# Patient Record
Sex: Female | Born: 1960 | Race: White | Hispanic: No | Smoking: Current every day smoker
Health system: Southern US, Community
[De-identification: ages and names within clinical notes are randomized; demographics above are authoritative.]

## PROBLEM LIST (undated history)

## (undated) DIAGNOSIS — I10 Essential (primary) hypertension: Secondary | ICD-10-CM

## (undated) DIAGNOSIS — R519 Headache, unspecified: Secondary | ICD-10-CM

## (undated) DIAGNOSIS — F32A Depression, unspecified: Secondary | ICD-10-CM

## (undated) DIAGNOSIS — F329 Major depressive disorder, single episode, unspecified: Secondary | ICD-10-CM

## (undated) DIAGNOSIS — T7840XA Allergy, unspecified, initial encounter: Secondary | ICD-10-CM

## (undated) DIAGNOSIS — M1990 Unspecified osteoarthritis, unspecified site: Secondary | ICD-10-CM

## (undated) HISTORY — DX: Major depressive disorder, single episode, unspecified: F32.9

## (undated) HISTORY — DX: Depression, unspecified: F32.A

## (undated) HISTORY — DX: Allergy, unspecified, initial encounter: T78.40XA

## (undated) HISTORY — DX: Unspecified osteoarthritis, unspecified site: M19.90

## (undated) HISTORY — DX: Essential (primary) hypertension: I10

## (undated) HISTORY — PX: TUBAL LIGATION: SHX77

---

## 1997-11-29 ENCOUNTER — Ambulatory Visit (HOSPITAL_COMMUNITY): Admission: RE | Admit: 1997-11-29 | Discharge: 1997-11-29 | Payer: Self-pay | Admitting: *Deleted

## 2000-11-10 ENCOUNTER — Other Ambulatory Visit: Admission: RE | Admit: 2000-11-10 | Discharge: 2000-11-10 | Payer: Self-pay | Admitting: Obstetrics and Gynecology

## 2005-06-05 ENCOUNTER — Other Ambulatory Visit: Admission: RE | Admit: 2005-06-05 | Discharge: 2005-06-05 | Payer: Self-pay | Admitting: Obstetrics and Gynecology

## 2010-10-07 ENCOUNTER — Encounter: Payer: Self-pay | Admitting: Family Medicine

## 2010-12-06 DIAGNOSIS — R079 Chest pain, unspecified: Secondary | ICD-10-CM

## 2010-12-18 ENCOUNTER — Ambulatory Visit (HOSPITAL_COMMUNITY)
Admission: RE | Admit: 2010-12-18 | Discharge: 2010-12-18 | Disposition: A | Discharge status: Home / Self Care | Payer: PRIVATE HEALTH INSURANCE | Source: Ambulatory Visit | Attending: Cardiovascular Disease | Admitting: Cardiovascular Disease

## 2010-12-18 DIAGNOSIS — F17200 Nicotine dependence, unspecified, uncomplicated: Secondary | ICD-10-CM | POA: Insufficient documentation

## 2010-12-18 DIAGNOSIS — R079 Chest pain, unspecified: Secondary | ICD-10-CM | POA: Insufficient documentation

## 2010-12-18 DIAGNOSIS — I10 Essential (primary) hypertension: Secondary | ICD-10-CM | POA: Insufficient documentation

## 2010-12-18 DIAGNOSIS — E785 Hyperlipidemia, unspecified: Secondary | ICD-10-CM | POA: Insufficient documentation

## 2010-12-27 NOTE — Procedures (Signed)
  NAMELOURA, PITT             ACCOUNT NO.:  [id number]  MEDICAL RECORD NO.:  [id number]           PATIENT TYPE:  O  LOCATION:  [facility]                         FACILITY:  [facility]  PHYSICIAN:  Nicki Guadalajara, M.D.     DATE OF BIRTH:  [birth year]  DATE OF PROCEDURE: DATE OF DISCHARGE:                           CARDIAC CATHETERIZATION   INDICATIONS:  Ms. Jamie Walters is a 50 year old female who has a history of hypertension, hyperlipidemia, tobacco use, is referred for diagnostic cardiac catheterization by Dr. Alanda Amass.  The patient recently had experienced episodes of chest pain leading to her recent [facility] evaluation.  Apparently, a nuclear perfusion study was performed with questionable inferior defects with minimal reversibility. With her risk factor profile, she is now referred for definitive diagnostic cardiac catheterization.  PROCEDURE:  After premedication with Versed 2 mg plus fentanyl 25 mcg, the patient was prepped and draped in usual fashion.  Right femoral artery was punctured anteriorly and a 5-French sheath was inserted. Diagnostic cardiac catheterization was done utilizing 5-French Judkins 4 left catheter as well as ultimately a no torque right catheter was necessary for selective angiography in the right coronary artery.  A 5- French pigtail catheter was used for left ventriculography as well as distal aortography with the patient's hypertensive history.  She tolerated the procedure well.  Hemostasis was obtained by direct manual pressure.  HEMODYNAMIC DATA:  Central aortic pressure is 120/66.  Left ventricular pressure 120/15.  ANGIOGRAPHIC DATA:  Left main coronary artery was angiographically normal and bifurcated into an LAD and circumflex system.  The LAD was angiographically normal gave rise to several diagonal septal perforating arteries and was free of disease.  The circumflex vessel was angiographically normal and gave rise to one major  marginal vessel.  The right coronary artery was a large-caliber dominant vessel which gave rise to a PDA and posterolateral system.  Left ventriculography revealed normal LV contractility with an ejection fraction of approximately 65% without focal segmental wall motion abnormalities.  Distal aortography revealed widely patent renal arteries.  There was no significant aortoiliac disease.  IMPRESSION: 1. Normal left ventricular function with an ejection fraction of     approximately 65%. 2. Normal coronary arteries. 3. Normal aortoiliac system.          ______________________________ Nicki Guadalajara, M.D.     TK/MEDQ  D:  12/18/2010  T:  12/19/2010  Job:  [id number]  cc:   Gerlene Burdock A. Alanda Amass, M.D. Jennefer Bravo, MD  Electronically Signed by Nicki Guadalajara M.D. on 12/27/2010 10:53:47 AM

## 2011-01-04 ENCOUNTER — Encounter: Payer: PRIVATE HEALTH INSURANCE | Admitting: Cardiology

## 2011-10-28 ENCOUNTER — Other Ambulatory Visit: Payer: Self-pay | Admitting: Physician Assistant

## 2011-10-29 ENCOUNTER — Other Ambulatory Visit: Payer: Self-pay | Admitting: Physician Assistant

## 2011-10-29 MED ORDER — HYDROCODONE-ACETAMINOPHEN 10-500 MG PO TABS: 1.0000 | ORAL_TABLET | Freq: Four times a day (QID) | ORAL | Status: AC | PRN

## 2011-11-26 ENCOUNTER — Other Ambulatory Visit: Payer: Self-pay | Admitting: Physician Assistant

## 2011-12-26 ENCOUNTER — Ambulatory Visit: Payer: Self-pay | Admitting: Physician Assistant

## 2011-12-26 ENCOUNTER — Other Ambulatory Visit: Payer: Self-pay | Admitting: Physician Assistant

## 2011-12-28 ENCOUNTER — Other Ambulatory Visit: Payer: Self-pay | Admitting: Physician Assistant

## 2011-12-31 ENCOUNTER — Telehealth: Payer: Self-pay

## 2011-12-31 NOTE — Telephone Encounter (Signed)
/  [facility] PATIENT  IS ASKING WHY SHE HAS NOT GOTTEN A REFILL ON HER HYDROCODONE? SHE SAID THE PHARMACY CALLED [facility] ON Sunday AND Monday AND THEY HAVE NOT GOTTEN A RESPONSE BACK FROM [place]. SHE USUALLY SEES [name] J. SHE NEEDS IT FILLED AS SOON AS POSSIBLE PLEASE. BEST PHONE [phone] (CELL)         PHARMACY CHOICE IS [facility]          [facility]

## 2012-01-01 ENCOUNTER — Telehealth: Payer: Self-pay

## 2012-01-01 ENCOUNTER — Other Ambulatory Visit: Payer: Self-pay | Admitting: Physician Assistant

## 2012-01-01 NOTE — Telephone Encounter (Signed)
Please phone in order for Hydrocodone.

## 2012-01-01 NOTE — Telephone Encounter (Signed)
LMOM for pt to call back. Chart on the back of TL desk in Clinical calls box.

## 2012-01-01 NOTE — Telephone Encounter (Signed)
I need to see her. I will authorize a 10-month supply (#120) if she will run out before she sees me.

## 2012-01-02 NOTE — Telephone Encounter (Signed)
Please contact pt on her cell she is not at home [phone]

## 2012-01-03 ENCOUNTER — Other Ambulatory Visit: Payer: Self-pay | Admitting: Physician Assistant

## 2012-01-03 NOTE — Telephone Encounter (Signed)
Pt CB. Explained that she will need to RTC to see [name] but that 1 mos supply authorized and I can call it in now for pt to [facility] in [place]. Pt stated she has appt sch beg of May. Called in Rx

## 2012-01-16 ENCOUNTER — Ambulatory Visit (INDEPENDENT_AMBULATORY_CARE_PROVIDER_SITE_OTHER): Payer: PRIVATE HEALTH INSURANCE | Admitting: Physician Assistant

## 2012-01-16 ENCOUNTER — Encounter: Payer: Self-pay | Admitting: Physician Assistant

## 2012-01-16 VITALS — BP 120/72 | HR 79 | Temp 98.7°F | Resp 16 | Ht 61.0 in | Wt 135.6 lb

## 2012-01-16 DIAGNOSIS — F329 Major depressive disorder, single episode, unspecified: Secondary | ICD-10-CM

## 2012-01-16 DIAGNOSIS — IMO0002 Reserved for concepts with insufficient information to code with codable children: Secondary | ICD-10-CM

## 2012-01-16 DIAGNOSIS — F32A Depression, unspecified: Secondary | ICD-10-CM

## 2012-01-16 DIAGNOSIS — F3289 Other specified depressive episodes: Secondary | ICD-10-CM

## 2012-01-16 DIAGNOSIS — I10 Essential (primary) hypertension: Secondary | ICD-10-CM

## 2012-01-16 DIAGNOSIS — M25569 Pain in unspecified knee: Secondary | ICD-10-CM

## 2012-01-16 DIAGNOSIS — E785 Hyperlipidemia, unspecified: Secondary | ICD-10-CM

## 2012-01-16 LAB — COMPREHENSIVE METABOLIC PANEL
ALT: 13 U/L (ref 0–35)
AST: 18 U/L (ref 0–37)
Albumin: 4.1 g/dL (ref 3.5–5.2)
BUN: 12 mg/dL (ref 6–23)
Calcium: 8.6 mg/dL (ref 8.4–10.5)
Chloride: 102 mEq/L (ref 96–112)
Potassium: 4.2 mEq/L (ref 3.5–5.3)
Sodium: 136 mEq/L (ref 135–145)
Total Protein: 6.8 g/dL (ref 6.0–8.3)

## 2012-01-16 LAB — LIPID PANEL: LDL Cholesterol: 151 mg/dL — ABNORMAL HIGH (ref 0–99)

## 2012-01-16 MED ORDER — MELOXICAM 15 MG PO TABS: 15.0000 mg | ORAL_TABLET | Freq: Every day | ORAL | Status: DC

## 2012-01-16 MED ORDER — BUPROPION HCL ER (XL) 300 MG PO TB24: 300.0000 mg | ORAL_TABLET | Freq: Every day | ORAL | Status: DC

## 2012-01-16 MED ORDER — PRAVASTATIN SODIUM 40 MG PO TABS: 40.0000 mg | ORAL_TABLET | Freq: Every day | ORAL | Status: DC

## 2012-01-16 MED ORDER — HYDROCODONE-ACETAMINOPHEN 10-500 MG PO TABS: 1.0000 | ORAL_TABLET | Freq: Four times a day (QID) | ORAL | Status: DC | PRN

## 2012-01-16 NOTE — Patient Instructions (Signed)
We have increased your Wellbutrin XR. We have added Meloxicam for your knee pain.   Please continue with exercise. Please consider counseling for your depression.    RICE: Routine Care for Injuries The routine care of many injuries includes Rest, Ice, Compression, and Elevation (RICE). HOME CARE INSTRUCTIONS  Rest is needed to allow your body to heal. Routine activities can usually be resumed when comfortable. Injured tendons and bones can take up to 6 weeks to heal. Tendons are the cord-like structures that attach muscle to bone.   Ice following an injury helps keep the swelling down and reduces pain.   Put ice in a plastic bag.   Place a towel between your skin and the bag.   Leave the ice on for 15 to 20 minutes, 3 to 4 times a day. Do this while awake, for the first 24 to 48 hours. After that, continue as directed by your caregiver.   Compression helps keep swelling down. It also gives support and helps with discomfort. If an elastic bandage has been applied, it should be removed and reapplied every 3 to 4 hours. It should not be applied tightly, but firmly enough to keep swelling down. Watch fingers or toes for swelling, bluish discoloration, coldness, numbness, or excessive pain. If any of these problems occur, remove the bandage and reapply loosely. Contact your caregiver if these problems continue.   Elevation helps reduce swelling and decreases pain. With extremities, such as the arms, hands, legs, and feet, the injured area should be placed near or above the level of the heart, if possible.  SEEK IMMEDIATE MEDICAL CARE IF:  You have persistent pain and swelling.   You develop redness, numbness, or unexpected weakness.   Your symptoms are getting worse rather than improving after several days.  These symptoms may indicate that further evaluation or further X-rays are needed. Sometimes, X-rays may not show a small broken bone (fracture) until 1 week or 10 days later. Make a  follow-up appointment with your caregiver. Ask when your X-ray results will be ready. Make sure you get your X-ray results. Document Released: 12/15/2000 Document Revised: 08/22/2011 Document Reviewed: 02/01/2011 [facility] Patient Information 2012 [place], [place].

## 2012-01-16 NOTE — Progress Notes (Signed)
I have examined this patient along with the student and agree.  

## 2012-01-16 NOTE — Progress Notes (Signed)
  Subjective:    Patient ID: Jamie Walters, female    DOB: 1960/11/02, 51 y.o.   MRN: [id number]  Hypertension  Hyperlipidemia   Jamie Walters is a 50 y/o female with a history of chronic depression, DDD of the lumbar spine, chronic low back pain, HTN and hyperlipidemia who presents today for med refills. She aslo c/o left knee pain.  Jamie Walters states that she is weepy and tired all the time.  She also has a lack of interest in sex and other pleasurable activities. She has to make herself get out of bed. Her pain levels have been worse lately.  She feels that her Wellbutrin is not working for her depression. Her appetite is good.  She denies SI.  She denies fevers, myalgias, soaking night sweats, unintended weight change or other constitutional symptoms.  Patient has been trying to exercise.  She has recently started walking 4 mi/day. On Saturday, she awoke with left knee pain and swelling.  Pain is worse with flexion. She recalls no injury and has no hx of injury to the joint. She denies clicking or popping. She does not feel as if her knee will give way or slide.    Review of Systems    as stated in HPI Objective:   Physical Exam  Vitals reviewed. Constitutional: She appears well-developed and well-nourished.       Sad appearing. Patient is weepy during exam.  Cardiovascular: Normal rate, regular rhythm and normal heart sounds.   Pulmonary/Chest: Effort normal and breath sounds normal. No respiratory distress.  Musculoskeletal:       Left Knee, Full A/PROM, crepitus present, TTP over the medial joint line with swelling consistent with small effusion.    Filed Vitals:   01/16/12 1418  BP: 120/72  Pulse: 79  Temp: 98.7 F (37.1 C)  Resp: 16         Assessment & Plan:   1. DDD (degenerative disc disease)  HYDROcodone-acetaminophen (LORTAB) 10-500 MG per tablet. Suggested trial of massage therapy..  2. Depression  buPROPion (WELLBUTRIN XL) 300 MG 24 hr tablet. Suggested  psychological counseling and continued exercise.    3. Hyperlipidemia  pravastatin (PRAVACHOL) 40 MG tablet, Comprehensive metabolic panel, Lipid Panel  4. Knee pain  meloxicam (MOBIC) 15 MG tablet, HYDROcodone-acetaminophen (LORTAB) 10-500 MG per tablet. Continue with low impact exercise including bicycling, swimming.  RICE when not active.   Supportive care see pt instructions.

## 2012-02-24 ENCOUNTER — Other Ambulatory Visit: Payer: Self-pay | Admitting: Physician Assistant

## 2012-03-17 ENCOUNTER — Other Ambulatory Visit: Payer: Self-pay | Admitting: Family Medicine

## 2012-03-17 DIAGNOSIS — M25569 Pain in unspecified knee: Secondary | ICD-10-CM

## 2012-03-17 DIAGNOSIS — F32A Depression, unspecified: Secondary | ICD-10-CM

## 2012-03-17 DIAGNOSIS — F329 Major depressive disorder, single episode, unspecified: Secondary | ICD-10-CM

## 2012-03-17 MED ORDER — BUPROPION HCL ER (XL) 300 MG PO TB24: 300.0000 mg | ORAL_TABLET | Freq: Every day | ORAL | Status: DC

## 2012-03-17 MED ORDER — MELOXICAM 15 MG PO TABS: 15.0000 mg | ORAL_TABLET | Freq: Every day | ORAL | Status: DC

## 2012-03-26 ENCOUNTER — Telehealth: Payer: Self-pay

## 2012-03-26 NOTE — Telephone Encounter (Signed)
Pt called pharmacy and requested rx refill on hydrocodone and would like it to be [facility] [place] she says pharmacy hasnt heard anything from [place].

## 2012-03-27 MED ORDER — HYDROCODONE-ACETAMINOPHEN 10-500 MG PO TABS: 1.0000 | ORAL_TABLET | Freq: Four times a day (QID) | ORAL | Status: DC | PRN

## 2012-03-27 NOTE — Telephone Encounter (Signed)
Done and printed

## 2012-03-27 NOTE — Telephone Encounter (Signed)
LMOM for pt that we have taken care of her request.

## 2012-04-21 ENCOUNTER — Other Ambulatory Visit: Payer: Self-pay | Admitting: Physician Assistant

## 2012-04-23 ENCOUNTER — Other Ambulatory Visit: Payer: Self-pay | Admitting: *Deleted

## 2012-04-23 NOTE — Telephone Encounter (Signed)
Rx done and ready to be faxed 

## 2012-04-26 ENCOUNTER — Other Ambulatory Visit: Payer: Self-pay | Admitting: Physician Assistant

## 2012-04-26 NOTE — Telephone Encounter (Signed)
Deny--refilled on 04/21/2012

## 2012-05-14 ENCOUNTER — Other Ambulatory Visit: Payer: Self-pay | Admitting: Physician Assistant

## 2012-05-29 ENCOUNTER — Telehealth: Payer: Self-pay

## 2012-05-29 MED ORDER — HYDROCODONE-ACETAMINOPHEN 10-500 MG PO TABS: 1.0000 | ORAL_TABLET | Freq: Four times a day (QID) | ORAL | Status: DC | PRN

## 2012-05-29 NOTE — Telephone Encounter (Signed)
Patient advised it was sent for her.

## 2012-05-29 NOTE — Telephone Encounter (Signed)
Rx done and ready to be faxed 

## 2012-05-29 NOTE — Telephone Encounter (Signed)
Patient would like hydrocodone refill.  Best [phone]

## 2012-05-29 NOTE — Telephone Encounter (Signed)
Was last seen in May by [facility]

## 2012-06-15 ENCOUNTER — Other Ambulatory Visit: Payer: Self-pay | Admitting: Physician Assistant

## 2012-06-29 ENCOUNTER — Encounter: Payer: Self-pay | Admitting: Physician Assistant

## 2012-06-30 ENCOUNTER — Telehealth: Payer: Self-pay

## 2012-06-30 NOTE — Telephone Encounter (Signed)
Checking on the status of pharmacy request for her HYDROcodone-acetaminophen (LORTAB) 10-500 MG per tablet   [phone] (M)

## 2012-07-02 ENCOUNTER — Telehealth: Payer: Self-pay

## 2012-07-02 MED ORDER — HYDROCODONE-ACETAMINOPHEN 10-500 MG PO TABS: 1.0000 | ORAL_TABLET | Freq: Four times a day (QID) | ORAL | Status: DC | PRN

## 2012-07-02 NOTE — Telephone Encounter (Signed)
PATIENT IS CALLING FOR HYDROCODONE RX REFILL THAT WE NORMALLY FAX TO [facility] IN [place], [place] SO SHE DOESN'T HAVE TO DRIVE TO PICK IT UP HERE. SHE CALLED 06/30/12 AS WELL, BUT I DON'T SEE ANY FURTHER NOTES ON THAT ENCOUTER.  BEST [phone]  [id number] - [place], [place] - [id number]

## 2012-07-02 NOTE — Telephone Encounter (Signed)
Called it in for her. Patient advised.

## 2012-07-02 NOTE — Telephone Encounter (Signed)
Please advise on hydrocodone refill, previous call was not routed.

## 2012-07-02 NOTE — Telephone Encounter (Signed)
Rx printed.  Fax/call it to her pharmacy per her request.

## 2012-07-15 ENCOUNTER — Other Ambulatory Visit: Payer: Self-pay | Admitting: Physician Assistant

## 2012-07-29 ENCOUNTER — Other Ambulatory Visit: Payer: Self-pay | Admitting: Physician Assistant

## 2012-08-14 ENCOUNTER — Other Ambulatory Visit: Payer: Self-pay | Admitting: Physician Assistant

## 2012-09-01 ENCOUNTER — Telehealth: Payer: Self-pay | Admitting: *Deleted

## 2012-09-01 MED ORDER — HYDROCODONE-ACETAMINOPHEN 10-325 MG PO TABS: 1.0000 | ORAL_TABLET | Freq: Four times a day (QID) | ORAL | Status: DC | PRN

## 2012-09-01 NOTE — Telephone Encounter (Signed)
Pharmacy requesting refill on hydrocodone.  Also needing to change dose to 10/325.  Does not come in 10/500 anymore.

## 2012-09-01 NOTE — Telephone Encounter (Signed)
rx printed

## 2012-09-02 NOTE — Telephone Encounter (Signed)
rx sent in 

## 2012-09-10 ENCOUNTER — Other Ambulatory Visit: Payer: Self-pay | Admitting: Physician Assistant

## 2012-09-15 ENCOUNTER — Other Ambulatory Visit: Payer: Self-pay | Admitting: Physician Assistant

## 2012-09-29 ENCOUNTER — Other Ambulatory Visit: Payer: Self-pay | Admitting: Physician Assistant

## 2012-10-02 NOTE — Telephone Encounter (Signed)
PT CALLED REGARDING HER REFILL ON HER HYDROCODONE, PT STATES THAT HER PHARMACY HAS SENT OVER A REFILL REQUEST ABOUT THREE DAYS AGO. PLEASE ADVISE. BEST# [phone]

## 2012-10-02 NOTE — Telephone Encounter (Signed)
Please advise on refill. Patient was last seen in May.

## 2012-10-03 ENCOUNTER — Other Ambulatory Visit: Payer: Self-pay | Admitting: Physician Assistant

## 2012-10-04 NOTE — Telephone Encounter (Signed)
Called in for her. Pt advised

## 2012-10-16 ENCOUNTER — Telehealth: Payer: Self-pay

## 2012-10-16 MED ORDER — BUPROPION HCL ER (XL) 300 MG PO TB24: 300.0000 mg | ORAL_TABLET | ORAL | Status: DC

## 2012-10-16 NOTE — Telephone Encounter (Signed)
PT STATES WE HAVE DENIED HER WELLBUTRIN AND SHE WOULD LIKE TO KNOW WHY. WE HAVE ALWAYS REFILLED IT EVERY MONTH FOR HER PLEASE CALL HER CELL AND LEAVE MESSAGE [phone]

## 2012-10-16 NOTE — Telephone Encounter (Signed)
Does she need to RTC? 

## 2012-10-16 NOTE — Telephone Encounter (Signed)
I do not see a refill request from the pharmacy for Wellbutrin, but I have sent it in.  Pt needs an appointment, has not been seen since May 2013

## 2012-10-16 NOTE — Telephone Encounter (Signed)
Left message for patient to advise

## 2012-11-02 ENCOUNTER — Other Ambulatory Visit: Payer: Self-pay | Admitting: Physician Assistant

## 2012-11-03 ENCOUNTER — Telehealth: Payer: Self-pay

## 2012-11-03 NOTE — Telephone Encounter (Signed)
This has been sent for her.

## 2012-11-03 NOTE — Telephone Encounter (Signed)
Patient says she has contacted her pharmacy about her hydrocodone she has appt in march, just wanted to let us know because we seem to have trouble with this each month she says.

## 2012-11-03 NOTE — Telephone Encounter (Signed)
Called her to advise. Left message  

## 2012-11-14 ENCOUNTER — Other Ambulatory Visit: Payer: Self-pay | Admitting: Physician Assistant

## 2012-11-19 ENCOUNTER — Encounter: Payer: PRIVATE HEALTH INSURANCE | Admitting: Physician Assistant

## 2012-12-03 ENCOUNTER — Ambulatory Visit (INDEPENDENT_AMBULATORY_CARE_PROVIDER_SITE_OTHER): Payer: PRIVATE HEALTH INSURANCE | Admitting: Physician Assistant

## 2012-12-03 ENCOUNTER — Encounter: Payer: Self-pay | Admitting: Physician Assistant

## 2012-12-03 VITALS — BP 126/72 | HR 75 | Temp 98.3°F | Resp 16 | Ht 61.0 in | Wt 134.4 lb

## 2012-12-03 DIAGNOSIS — R51 Headache: Secondary | ICD-10-CM

## 2012-12-03 DIAGNOSIS — F32A Depression, unspecified: Secondary | ICD-10-CM | POA: Insufficient documentation

## 2012-12-03 DIAGNOSIS — B3731 Acute candidiasis of vulva and vagina: Secondary | ICD-10-CM

## 2012-12-03 DIAGNOSIS — F3289 Other specified depressive episodes: Secondary | ICD-10-CM

## 2012-12-03 DIAGNOSIS — R519 Headache, unspecified: Secondary | ICD-10-CM | POA: Insufficient documentation

## 2012-12-03 DIAGNOSIS — Z720 Tobacco use: Secondary | ICD-10-CM

## 2012-12-03 DIAGNOSIS — M5137 Other intervertebral disc degeneration, lumbosacral region: Secondary | ICD-10-CM

## 2012-12-03 DIAGNOSIS — Z1159 Encounter for screening for other viral diseases: Secondary | ICD-10-CM

## 2012-12-03 DIAGNOSIS — I779 Disorder of arteries and arterioles, unspecified: Secondary | ICD-10-CM | POA: Insufficient documentation

## 2012-12-03 DIAGNOSIS — B373 Candidiasis of vulva and vagina: Secondary | ICD-10-CM

## 2012-12-03 DIAGNOSIS — I10 Essential (primary) hypertension: Secondary | ICD-10-CM

## 2012-12-03 DIAGNOSIS — L293 Anogenital pruritus, unspecified: Secondary | ICD-10-CM

## 2012-12-03 DIAGNOSIS — M5116 Intervertebral disc disorders with radiculopathy, lumbar region: Secondary | ICD-10-CM | POA: Insufficient documentation

## 2012-12-03 DIAGNOSIS — F329 Major depressive disorder, single episode, unspecified: Secondary | ICD-10-CM

## 2012-12-03 DIAGNOSIS — J309 Allergic rhinitis, unspecified: Secondary | ICD-10-CM

## 2012-12-03 DIAGNOSIS — Z1211 Encounter for screening for malignant neoplasm of colon: Secondary | ICD-10-CM

## 2012-12-03 DIAGNOSIS — E785 Hyperlipidemia, unspecified: Secondary | ICD-10-CM

## 2012-12-03 DIAGNOSIS — N898 Other specified noninflammatory disorders of vagina: Secondary | ICD-10-CM

## 2012-12-03 DIAGNOSIS — F17200 Nicotine dependence, unspecified, uncomplicated: Secondary | ICD-10-CM

## 2012-12-03 LAB — CBC WITH DIFFERENTIAL/PLATELET
Basophils Absolute: 0 10*3/uL (ref 0.0–0.1)
Basophils Relative: 0 % (ref 0–1)
Eosinophils Absolute: 0.2 10*3/uL (ref 0.0–0.7)
HCT: 39 % (ref 36.0–46.0)
Hemoglobin: 13.1 g/dL (ref 12.0–15.0)
Lymphs Abs: 3.2 10*3/uL (ref 0.7–4.0)
MCV: 91.3 fL (ref 78.0–100.0)
Neutrophils Relative %: 44 % (ref 43–77)
Platelets: 285 10*3/uL (ref 150–400)
WBC: 7.5 10*3/uL (ref 4.0–10.5)

## 2012-12-03 LAB — POCT UA - MICROSCOPIC ONLY: Yeast, UA: NEGATIVE

## 2012-12-03 LAB — POCT URINALYSIS DIPSTICK
Bilirubin, UA: NEGATIVE
Glucose, UA: NEGATIVE
Nitrite, UA: NEGATIVE
Spec Grav, UA: 1.01
Urobilinogen, UA: 0.2

## 2012-12-03 LAB — POCT WET PREP WITH KOH
Clue Cells Wet Prep HPF POC: NEGATIVE
RBC Wet Prep HPF POC: NEGATIVE
Trichomonas, UA: NEGATIVE

## 2012-12-03 MED ORDER — FLUCONAZOLE 150 MG PO TABS: 150.0000 mg | ORAL_TABLET | Freq: Once | ORAL | Status: DC

## 2012-12-03 MED ORDER — IPRATROPIUM BROMIDE 0.03 % NA SOLN: 2.0000 | Freq: Two times a day (BID) | NASAL | Status: DC

## 2012-12-03 MED ORDER — HYDROCODONE-ACETAMINOPHEN 10-325 MG PO TABS: 1.0000 | ORAL_TABLET | Freq: Four times a day (QID) | ORAL | Status: DC | PRN

## 2012-12-03 MED ORDER — PRAVASTATIN SODIUM 40 MG PO TABS: 40.0000 mg | ORAL_TABLET | Freq: Every day | ORAL | Status: DC

## 2012-12-03 MED ORDER — METOPROLOL SUCCINATE ER 25 MG PO TB24: 25.0000 mg | ORAL_TABLET | Freq: Every day | ORAL | Status: DC

## 2012-12-03 MED ORDER — VARENICLINE TARTRATE 1 MG PO TABS: 1.0000 mg | ORAL_TABLET | Freq: Two times a day (BID) | ORAL | Status: DC

## 2012-12-03 MED ORDER — BUPROPION HCL ER (XL) 300 MG PO TB24: 300.0000 mg | ORAL_TABLET | ORAL | Status: DC

## 2012-12-03 NOTE — Patient Instructions (Addendum)
If you have not heard anything regarding the colonoscopy referral in 10 days, please contact our office.  Select a quit date at least 7 days in the future.  Start the Chantix by taking 1/2 tablet each evening x 3 days, then take 1/2 tablet twice dailt x 3 days, then 1/2 tablet each morning and 1 tablet each evening x 3 days, the 1 tablet twice daily.

## 2012-12-03 NOTE — Progress Notes (Signed)
Subjective:    Patient ID: Jamie Walters, female    DOB: [birth year], 52 y.o.   MRN: [id number]  HPI This 52 y.o. female presents for evaluation of her chronic medical problems.  This visit was originally scheduled as a CPE, but unfortunately inadequate time was allotted.  Her CPE has been rescheduled.   Past Medical History  Diagnosis Date  . Arthritis   . Depression   . Hypertension   . Allergy     Past Surgical History  Procedure Laterality Date  . Tubal ligation      Prior to Admission medications   Medication Sig Start Date End Date Taking? Authorizing Provider  aspirin 81 MG tablet Take 81 mg by mouth daily.   Yes Historical Provider, MD  buPROPion (WELLBUTRIN XL) 300 MG 24 hr tablet Take 1 tablet (300 mg total) by mouth every morning. 12/03/12  Yes Ulice Follett S Jamauri Kruzel, [name]  HYDROcodone-acetaminophen (NORCO) 10-325 MG per tablet Take 1 tablet by mouth every 6 (six) hours as needed for pain. Need office visit for additional refills. 12/03/12  Yes Yasin Ducat S Amarri Michaelson, [name]  metoprolol succinate (TOPROL-XL) 25 MG 24 hr tablet Take 1 tablet (25 mg total) by mouth daily. 12/03/12  Yes Gradyn Shein S Joyia Riehle, [name]  pravastatin (PRAVACHOL) 40 MG tablet Take 1 tablet (40 mg total) by mouth daily. 12/03/12  Yes Charlotte Brafford S Delsie Amador, [name]  ipratropium (ATROVENT) 0.03 % nasal spray Place 2 sprays into the nose 2 (two) times daily. 12/03/12   Akshat Minehart Tessa Lerner, [name]  varenicline (CHANTIX) 1 MG tablet Take 1 tablet (1 mg total) by mouth 2 (two) times daily. Use for smoking cessation. 12/03/12   Ralpheal Zappone Tessa Lerner, [name]    No Known Allergies  History   Social History  . Marital Status: Divorced    Spouse Name: Tiburcio Bash    Number of Children: 2  . Years of Education: 11   Occupational History  . Lead Inspector    Social History Main Topics  . Smoking status: Current Every Day Smoker    Types: Cigarettes  . Smokeless tobacco: Not on file     Comment: "im really trying." Interested in Chantix.  Marland Kitchen  Alcohol Use: No  . Drug Use: No  . Sexually Active: Yes -- Female partner(s)    Birth Control/ Protection: Post-menopausal   Other Topics Concern  . Not on file   Social History Narrative   Lives with her common law husband.    Family History  Problem Relation Age of Onset  . Hypertension Mother   . Hyperlipidemia Mother   . COPD Father   . Alcohol abuse Father   . Arthritis Father   . Cancer Father     bone cancer  . Hypertension Sister   . COPD Brother   . COPD Brother    Review of Systems  Constitutional: Negative.   HENT: Positive for congestion, rhinorrhea and postnasal drip.   Eyes: Negative.   Respiratory: Negative.   Cardiovascular: Negative.   Gastrointestinal: Negative.   Endocrine: Negative.   Genitourinary: Negative for dysuria, urgency, frequency, hematuria and vaginal discharge (vaingal itching intermittently x several months, uses OTC anti-fungal as needed with temporary relief).  Musculoskeletal: Positive for myalgias (back) and back pain (seems worse than before, but current treatment is adequate.  Has seen neurosurgery previously.). Negative for joint swelling.  Allergic/Immunologic: Negative.   Neurological: Negative.   Hematological: Negative.   Psychiatric/Behavioral: Positive for dysphoric mood (wonders if she can increase the dose of  Wellbutrin XL to 600 mg). Negative for self-injury. The patient is not nervous/anxious.        Objective:   Physical Exam  Vitals reviewed. Constitutional: She is oriented to person, place, and time. Vital signs are normal. She appears well-developed and well-nourished. She is active and cooperative. No distress.  HENT:  Head: Normocephalic and atraumatic.  Right Ear: Hearing and external ear normal.  Left Ear: Hearing and external ear normal.  Nose: Nose normal.  Mouth/Throat: No oropharyngeal exudate.  Drainage noted in the posterior pharynx  Eyes: Conjunctivae and EOM are normal. Pupils are equal, round, and  reactive to light. Right eye exhibits no discharge. Left eye exhibits no discharge. No scleral icterus.  Neck: Normal range of motion. Neck supple. No thyromegaly present.  Cardiovascular: Normal rate, regular rhythm and normal heart sounds.   Pulses:      Radial pulses are 2+ on the right side, and 2+ on the left side.       Dorsalis pedis pulses are 2+ on the right side, and 2+ on the left side.       Posterior tibial pulses are 2+ on the right side, and 2+ on the left side.  Pulmonary/Chest: Effort normal and breath sounds normal.  Musculoskeletal: Normal range of motion. She exhibits tenderness (lumar paraspinous muscles). She exhibits no edema.       Thoracic back: Normal.       Lumbar back: She exhibits tenderness. She exhibits no bony tenderness, no swelling, no edema, no deformity, no laceration and no spasm.  Lymphadenopathy:       Head (right side): No tonsillar, no preauricular, no posterior auricular and no occipital adenopathy present.       Head (left side): No tonsillar, no preauricular, no posterior auricular and no occipital adenopathy present.    She has no cervical adenopathy.       Right: No supraclavicular adenopathy present.       Left: No supraclavicular adenopathy present.  Neurological: She is alert and oriented to person, place, and time. No sensory deficit.  Skin: Skin is warm, dry and intact. No rash noted. No cyanosis or erythema. Nails show no clubbing.  Psychiatric: She has a normal mood and affect. Her behavior is normal. Judgment normal.      Results for orders placed in visit on 12/03/12  POCT UA - MICROSCOPIC ONLY      Result Value Range   WBC, Ur, HPF, POC 0-3     RBC, urine, microscopic 0-3     Bacteria, U Microscopic trace     Mucus, UA trace     Epithelial cells, urine per micros 0-8     Crystals, Ur, HPF, POC neg     Casts, Ur, LPF, POC neg     Yeast, UA neg    POCT URINALYSIS DIPSTICK      Result Value Range   Color, UA yellow      Clarity, UA clear     Glucose, UA neg     Bilirubin, UA neg     Ketones, UA neg     Spec Grav, UA 1.010     Blood, UA trace     pH, UA 7.0     Protein, UA neg     Urobilinogen, UA 0.2     Nitrite, UA neg     Leukocytes, UA small (1+)    POCT WET PREP WITH KOH      Result Value Range   Trichomonas, UA  Negative     Clue Cells Wet Prep HPF POC neg     Epithelial Wet Prep HPF POC 0-18     Yeast Wet Prep HPF POC neg     Bacteria Wet Prep HPF POC small     RBC Wet Prep HPF POC neg     WBC Wet Prep HPF POC 0-4     KOH Prep POC Positive         Assessment & Plan:  HTN (hypertension) - Plan: CBC with Differential, Comprehensive metabolic panel, TSH, metoprolol succinate (TOPROL-XL) 25 MG 24 hr tablet, POCT UA - Microscopic Only, POCT urinalysis dipstick  Hyperlipidemia - Plan: Lipid panel, pravastatin (PRAVACHOL) 40 MG tablet  Mild carotid artery disease - control HTN and hyperlipidemia as above.  DDD (degenerative disc disease), lumbosacral - Plan: HYDROcodone-acetaminophen (NORCO) 10-325 MG per tablet  Depression - Plan: buPROPion (WELLBUTRIN XL) 300 MG 24 hr tablet; discussed increasing the Wellbutrin from 300 to 450, but she declines for now.  Will let me know if she changes her mind.  HA (headache) - stable  Screening for colon cancer - Plan: Ambulatory referral to Gastroenterology  Need for hepatitis C screening test - Plan: Hepatitis C antibody  Allergic rhinitis - Plan: ipratropium (ATROVENT) 0.03 % nasal spray, as this was successful previously.  Tobacco abuse - Plan: varenicline (CHANTIX) 1 MG tablet  Vaginal itching - Plan: POCT Wet Prep with KOH  Yeast vaginitis - Plan: fluconazole (DIFLUCAN) 150 MG tablet  Fernande Bras, [name] Certified Physician Assistant [facility]

## 2012-12-04 ENCOUNTER — Encounter: Payer: Self-pay | Admitting: Physician Assistant

## 2012-12-04 LAB — COMPREHENSIVE METABOLIC PANEL
ALT: 21 U/L (ref 0–35)
AST: 26 U/L (ref 0–37)
Alkaline Phosphatase: 64 U/L (ref 39–117)
CO2: 25 mEq/L (ref 19–32)
Sodium: 139 mEq/L (ref 135–145)
Total Bilirubin: 0.3 mg/dL (ref 0.3–1.2)
Total Protein: 6.9 g/dL (ref 6.0–8.3)

## 2012-12-04 LAB — LIPID PANEL
LDL Cholesterol: 122 mg/dL — ABNORMAL HIGH (ref 0–99)
VLDL: 18 mg/dL (ref 0–40)

## 2012-12-04 LAB — HEPATITIS C ANTIBODY: HCV Ab: NEGATIVE

## 2012-12-04 LAB — TSH: TSH: 1.109 u[IU]/mL (ref 0.350–4.500)

## 2012-12-31 ENCOUNTER — Other Ambulatory Visit: Payer: Self-pay | Admitting: Physician Assistant

## 2013-01-01 ENCOUNTER — Telehealth: Payer: Self-pay

## 2013-01-01 DIAGNOSIS — M5137 Other intervertebral disc degeneration, lumbosacral region: Secondary | ICD-10-CM

## 2013-01-01 NOTE — Telephone Encounter (Signed)
Pended please advise.  

## 2013-01-01 NOTE — Telephone Encounter (Signed)
PT STATES IT IS TIME FOR HER HYDROCODONE TO BE REFILLED AND SHE WANTED [name] TO KNOW PLEASE CALL PT AT [phone]     [facility]

## 2013-01-02 ENCOUNTER — Telehealth: Payer: Self-pay

## 2013-01-02 NOTE — Telephone Encounter (Signed)
Pt is requesting the status of a refill request  Best number [phone]

## 2013-01-03 MED ORDER — HYDROCODONE-ACETAMINOPHEN 10-325 MG PO TABS: 1.0000 | ORAL_TABLET | Freq: Four times a day (QID) | ORAL | Status: DC | PRN

## 2013-01-03 NOTE — Telephone Encounter (Signed)
Pt notified that rx was sent in 

## 2013-01-03 NOTE — Telephone Encounter (Signed)
Pt notified that rx was sent in to pharmacy

## 2013-01-03 NOTE — Telephone Encounter (Signed)
Meds ordered this encounter  Medications  . HYDROcodone-acetaminophen (NORCO) 10-325 MG per tablet    Sig: Take 1 tablet by mouth every 6 (six) hours as needed for pain.    Dispense:  120 tablet    Refill:  0    Order Specific Question:  Supervising Provider    Answer:  DOOLITTLE, ROBERT P [[id number]]

## 2013-02-01 ENCOUNTER — Other Ambulatory Visit: Payer: Self-pay | Admitting: Physician Assistant

## 2013-02-02 ENCOUNTER — Other Ambulatory Visit: Payer: Self-pay | Admitting: Radiology

## 2013-03-03 ENCOUNTER — Other Ambulatory Visit: Payer: Self-pay | Admitting: Physician Assistant

## 2013-03-03 ENCOUNTER — Telehealth: Payer: Self-pay

## 2013-03-03 MED ORDER — HYDROCODONE-ACETAMINOPHEN 10-325 MG PO TABS: ORAL_TABLET | ORAL | Status: DC

## 2013-03-03 NOTE — Telephone Encounter (Signed)
Pt is calling for refill on hydrocodone

## 2013-03-03 NOTE — Telephone Encounter (Signed)
rx printed.  Meds ordered this encounter  Medications  . HYDROcodone-acetaminophen (NORCO) 10-325 MG per tablet    Sig: TAKE ONE TABLET BY MOUTH EVERY 6 HOURS AS NEEDED FOR PAIN    Dispense:  120 tablet    Refill:  0    Order Specific Question:  Supervising Provider    Answer:  DOOLITTLE, ROBERT P [[id number]]

## 2013-03-04 ENCOUNTER — Encounter: Payer: PRIVATE HEALTH INSURANCE | Admitting: Physician Assistant

## 2013-04-06 ENCOUNTER — Other Ambulatory Visit: Payer: Self-pay | Admitting: Physician Assistant

## 2013-05-03 ENCOUNTER — Other Ambulatory Visit: Payer: Self-pay | Admitting: Physician Assistant

## 2013-05-05 ENCOUNTER — Telehealth: Payer: Self-pay

## 2013-05-05 NOTE — Telephone Encounter (Signed)
Patient would like for chelle to refill her hydrocodone if possible please call patient at [phone]

## 2013-05-06 NOTE — Telephone Encounter (Signed)
rx printed

## 2013-05-06 NOTE — Telephone Encounter (Signed)
Called pt to let her know I faxed Rx to the pharmacy.

## 2013-06-02 ENCOUNTER — Other Ambulatory Visit: Payer: Self-pay | Admitting: Physician Assistant

## 2013-06-03 ENCOUNTER — Other Ambulatory Visit: Payer: Self-pay | Admitting: Radiology

## 2013-06-10 ENCOUNTER — Other Ambulatory Visit: Payer: Self-pay | Admitting: Physician Assistant

## 2013-06-30 ENCOUNTER — Telehealth: Payer: Self-pay

## 2013-06-30 NOTE — Telephone Encounter (Signed)
Please advise pended 

## 2013-06-30 NOTE — Telephone Encounter (Signed)
PT IN NEED OF HER HYDROCODONE. PLEASE CALL [phone] WHEN READY FOR PICK UP

## 2013-07-01 MED ORDER — HYDROCODONE-ACETAMINOPHEN 10-325 MG PO TABS: ORAL_TABLET | ORAL | Status: DC

## 2013-07-01 NOTE — Telephone Encounter (Signed)
Rx printed. Please advise patient that she needs OV for additional refills.

## 2013-07-01 NOTE — Telephone Encounter (Signed)
Called pt advised Rx ready to pick up.   

## 2013-07-05 ENCOUNTER — Telehealth: Payer: Self-pay

## 2013-07-05 NOTE — Telephone Encounter (Signed)
Patient has called back and has been advised

## 2013-07-05 NOTE — Telephone Encounter (Signed)
This was done on 10/16 Rx is at front desk for pick up.

## 2013-07-05 NOTE — Telephone Encounter (Signed)
Left message for her to call me back. 

## 2013-07-05 NOTE — Telephone Encounter (Signed)
Patient would like a refill on her hydrocodone work number is [phone]

## 2013-08-04 ENCOUNTER — Telehealth: Payer: Self-pay

## 2013-08-04 MED ORDER — HYDROCODONE-ACETAMINOPHEN 10-325 MG PO TABS: ORAL_TABLET | ORAL | Status: DC

## 2013-08-04 NOTE — Telephone Encounter (Signed)
PT WOULD LIKE TO HAVE A WRITTEN PRESCRIPTION FOR HER HYDROCODONE AND SINCE SHE LIVE IN [place], WOULD WE CERTIFY AND MAIL IT TO HER PLEASE CALL [phone]

## 2013-08-04 NOTE — Telephone Encounter (Signed)
[name] - Pt says she needs a refill on her pain medicine.  She set up an appointment for Dec. 4 with you.  This was the first available and she didn't really seem interested in the walk-in.  She wants you to know she was unaware that she had to be seen again before it could be filled.  Please call [phone]

## 2013-08-04 NOTE — Telephone Encounter (Signed)
Spoke to patient, advised she needs appt, she states she was not aware of this, transferred her to schedule. Do you want to give her a supply until she can come in?

## 2013-08-04 NOTE — Telephone Encounter (Signed)
Rx printed.  Meds ordered this encounter  Medications  . HYDROcodone-acetaminophen (NORCO) 10-325 MG per tablet    Sig: TAKE ONE TABLET BY MOUTH EVERY 6 HOURS AS NEEDED FOR PAIN. Need office visit for additional refills.    Dispense:  120 tablet    Refill:  0    Order Specific Question:  Supervising Provider    Answer:  DOOLITTLE, ROBERT P [[id number]]

## 2013-08-04 NOTE — Telephone Encounter (Signed)
She needs office visit for refills. Called her to advise. Left message for her to call me back.

## 2013-08-05 NOTE — Telephone Encounter (Signed)
Patient advised this is ready for her, she should keep appt for Dec 4th.

## 2013-08-19 ENCOUNTER — Encounter: Payer: Self-pay | Admitting: Physician Assistant

## 2013-08-19 ENCOUNTER — Ambulatory Visit (INDEPENDENT_AMBULATORY_CARE_PROVIDER_SITE_OTHER): Payer: PRIVATE HEALTH INSURANCE | Admitting: Physician Assistant

## 2013-08-19 VITALS — BP 150/84 | HR 75 | Temp 98.1°F | Resp 16 | Ht 61.5 in | Wt 144.0 lb

## 2013-08-19 DIAGNOSIS — F329 Major depressive disorder, single episode, unspecified: Secondary | ICD-10-CM

## 2013-08-19 DIAGNOSIS — I10 Essential (primary) hypertension: Secondary | ICD-10-CM

## 2013-08-19 DIAGNOSIS — L299 Pruritus, unspecified: Secondary | ICD-10-CM

## 2013-08-19 DIAGNOSIS — I779 Disorder of arteries and arterioles, unspecified: Secondary | ICD-10-CM

## 2013-08-19 DIAGNOSIS — F3289 Other specified depressive episodes: Secondary | ICD-10-CM

## 2013-08-19 DIAGNOSIS — E785 Hyperlipidemia, unspecified: Secondary | ICD-10-CM

## 2013-08-19 DIAGNOSIS — M5137 Other intervertebral disc degeneration, lumbosacral region: Secondary | ICD-10-CM

## 2013-08-19 DIAGNOSIS — Z1211 Encounter for screening for malignant neoplasm of colon: Secondary | ICD-10-CM

## 2013-08-19 DIAGNOSIS — F32A Depression, unspecified: Secondary | ICD-10-CM

## 2013-08-19 DIAGNOSIS — R51 Headache: Secondary | ICD-10-CM

## 2013-08-19 DIAGNOSIS — M51379 Other intervertebral disc degeneration, lumbosacral region without mention of lumbar back pain or lower extremity pain: Secondary | ICD-10-CM

## 2013-08-19 DIAGNOSIS — G4700 Insomnia, unspecified: Secondary | ICD-10-CM

## 2013-08-19 LAB — CBC WITH DIFFERENTIAL/PLATELET
Basophils Absolute: 0 10*3/uL (ref 0.0–0.1)
Basophils Relative: 0 % (ref 0–1)
Eosinophils Absolute: 0.3 10*3/uL (ref 0.0–0.7)
MCH: 30.7 pg (ref 26.0–34.0)
MCHC: 34.4 g/dL (ref 30.0–36.0)
Neutro Abs: 4.6 10*3/uL (ref 1.7–7.7)
Neutrophils Relative %: 49 % (ref 43–77)
RDW: 13.3 % (ref 11.5–15.5)

## 2013-08-19 LAB — LIPID PANEL
Cholesterol: 181 mg/dL (ref 0–200)
LDL Cholesterol: 81 mg/dL (ref 0–99)
Triglycerides: 303 mg/dL — ABNORMAL HIGH (ref <150)
VLDL: 61 mg/dL — ABNORMAL HIGH (ref 0–40)

## 2013-08-19 MED ORDER — PRAVASTATIN SODIUM 40 MG PO TABS: 40.0000 mg | ORAL_TABLET | Freq: Every day | ORAL | Status: DC

## 2013-08-19 MED ORDER — HYDROXYZINE HCL 25 MG PO TABS: 12.5000 mg | ORAL_TABLET | Freq: Every evening | ORAL | Status: DC | PRN

## 2013-08-19 MED ORDER — HYDROCODONE-ACETAMINOPHEN 10-325 MG PO TABS: 1.0000 | ORAL_TABLET | Freq: Four times a day (QID) | ORAL | Status: DC | PRN

## 2013-08-19 MED ORDER — BUPROPION HCL ER (XL) 300 MG PO TB24: ORAL_TABLET | ORAL | Status: DC

## 2013-08-19 MED ORDER — METOPROLOL SUCCINATE ER 25 MG PO TB24: ORAL_TABLET | ORAL | Status: DC

## 2013-08-19 MED ORDER — HYDROCODONE-ACETAMINOPHEN 10-325 MG PO TABS: ORAL_TABLET | ORAL | Status: DC

## 2013-08-19 NOTE — Progress Notes (Signed)
Subjective:    Patient ID: Jamie Walters, female    DOB: 11-18-1960, 52 y.o.   MRN: [id number]  Chief Complaint  Patient presents with  . Medication Refill  . Itching    elbows, heels, between fingers   Medications, allergies, past medical history, surgical history, family history, social history and problem list reviewed and updated.  Patient Active Problem List   Diagnosis Date Noted  . HTN (hypertension) 12/03/2012  . Hyperlipidemia 12/03/2012  . Mild carotid artery disease 12/03/2012  . DDD (degenerative disc disease), lumbosacral 12/03/2012  . Depression 12/03/2012  . HA (headache) 12/03/2012   HPI  "Everytime I sit down, I fall asleep.  But when I lie down, I might as well forget it." Works 12 8-10 hour days on, then has 2 off.  Several weeks ago developed itching both wrsits, elbows and both heels.  RIGHT elbow got swollen and painful.  Was seen elsewhere, had it drained and started on an antibiotic. Pain resolved.  Still some redness and swelling at the olecranon.  Never scheduled her colonoscopy when I referred her.  Will quit smoking again in January. Had quit, but restarted when her [place].  She was especially distressed that her daughter and son-in-law moved before either of them had jobs.  Her daughter is now working for the [employer], and her son-in-law is working for [employer].  Dry skin spot on the LEFT lateral distal thigh. Gets bumped and bleeds sometimes when she shaves. Present x several months.  Review of Systems     Objective:   Physical Exam Blood pressure 150/84, pulse 75, temperature 98.1 F (36.7 C), temperature source Oral, resp. rate 16, height 5' 1.5" (1.562 m), weight 144 lb (65.318 kg), SpO2 100.00%. Body mass index is 26.77 kg/(m^2). Well-developed, well nourished WF who is awake, alert and oriented, in NAD. HEENT: [place]/AT, PERRL, EOMI.  Sclera and conjunctiva are clear.  EAC are patent, TMs are normal  in appearance. Nasal mucosa is pink and moist. OP is clear. Neck: supple, non-tender, no lymphadenopathy, thyromegaly. Heart: RRR, no murmur Lungs: normal effort, CTA Abdomen: normo-active bowel sounds, supple, non-tender, no mass or organomegaly. Extremities: no cyanosis, clubbing or edema. Back: tenderness in the lumbar region.  No palpable spasm. Skin: warm and dry without rash. Psychologic: depressed mood and sad affect, normal speech and behavior. Tearful.        Assessment & Plan:  1. Depression Counseled. Encouraged to seek out stress relief and to do things intentionally that make her happy.  Her work schedule makes that very difficult. Continue Wellbutrin. - buPROPion (WELLBUTRIN XL) 300 MG 24 hr tablet; TAKE ONE TABLET BY MOUTH IN THE MORNING  Dispense: 30 tablet; Refill: 5  2. DDD (degenerative disc disease), lumbosacral Stable. Continue current treatment. - HYDROcodone-acetaminophen (NORCO) 10-325 MG per tablet; TAKE ONE TABLET BY MOUTH EVERY 6 HOURS AS NEEDED FOR PAIN.  Dispense: 120 tablet; Refill: 0  3. HTN (hypertension) Above goal today, possibly due to tearfulness. Continue current treatment. - CBC with Differential - metoprolol succinate (TOPROL-XL) 25 MG 24 hr tablet; TAKE ONE TABLET BY MOUTH ONCE DAILY  Dispense: 30 tablet; Refill: 5  4. Hyperlipidemia Await labs.  Continue current treatment. - Lipid panel - pravastatin (PRAVACHOL) 40 MG tablet; Take 1 tablet (40 mg total) by mouth daily.  Dispense: 30 tablet; Refill: 6  5. Itching Unclear etiology. - hydrOXYzine (ATARAX/VISTARIL) 25 MG tablet; Take 0.5-2 tablets (12.5-50 mg total) by mouth  at bedtime as needed for anxiety or itching (insomnia).  Dispense: 60 tablet; Refill: 1  6. Insomnia Due to increased stress. - hydrOXYzine (ATARAX/VISTARIL) 25 MG tablet; Take 0.5-2 tablets (12.5-50 mg total) by mouth at bedtime as needed for anxiety or itching (insomnia).  Dispense: 60 tablet; Refill: 1  7. Screening  for colon cancer Stressed the importance of this screening. - Ambulatory referral to Gastroenterology   Fernande Bras, [name] Physician Assistant-Certified [facility]

## 2013-08-19 NOTE — Patient Instructions (Addendum)
Do one thing every day that makes you feel happy and/or relaxed. Get at least 150 minutes of exercise each week, separate from your work.  I will contact you with your lab results as soon as they are available.   If you have not heard from me in 2 weeks, please contact me.  The fastest way to get your results is to register for My Chart (see the instructions on the last page of this printout).  If you have not heard anything regarding the referral in 2 weeks, please contact our office.

## 2013-08-22 ENCOUNTER — Encounter: Payer: Self-pay | Admitting: Physician Assistant

## 2013-09-03 ENCOUNTER — Encounter: Payer: Self-pay | Admitting: Physician Assistant

## 2013-09-13 ENCOUNTER — Telehealth: Payer: Self-pay

## 2013-09-13 NOTE — Telephone Encounter (Signed)
PT STATES THE MEDICINE GIVEN TO HER ISN'T HELPING, CAN'T SLEEP AND IS FEELING WORST. PLEASE CALL [phone]

## 2013-09-13 NOTE — Telephone Encounter (Signed)
Called her to advise.  

## 2013-09-13 NOTE — Telephone Encounter (Signed)
Please ask her to RTC

## 2013-09-13 NOTE — Telephone Encounter (Signed)
Is she referring to the meds given to help her sleep? Left message for her to call me back.

## 2013-09-13 NOTE — Telephone Encounter (Signed)
She states the meds for itching is not helping, she is still itching terribly and not sleeping, indicates she is "raw" and itching. No rash. Just itching/ scratched badly.

## 2013-10-05 ENCOUNTER — Encounter: Payer: Self-pay | Admitting: Physician Assistant

## 2013-10-05 ENCOUNTER — Telehealth: Payer: Self-pay | Admitting: Family Medicine

## 2013-10-05 NOTE — Telephone Encounter (Signed)
Spoke to patient about Jamie Walters not being able to get in touch with her to schedule a Colonoscopy. She informed me she would call the right away. She was un aware they were trying to contact her.

## 2013-11-30 ENCOUNTER — Telehealth: Payer: Self-pay

## 2013-11-30 DIAGNOSIS — M5137 Other intervertebral disc degeneration, lumbosacral region: Secondary | ICD-10-CM

## 2013-11-30 NOTE — Telephone Encounter (Signed)
[name]:  Patient needs a refill of Hydrocodone 10-325 MG.  Please call her when ready to pick up.  [phone]

## 2013-12-01 MED ORDER — HYDROCODONE-ACETAMINOPHEN 10-325 MG PO TABS: ORAL_TABLET | ORAL | Status: DC

## 2013-12-01 NOTE — Telephone Encounter (Signed)
Would one of you please do this for me while I'm out this week?

## 2013-12-01 NOTE — Telephone Encounter (Signed)
Lm rx at desk to p/u

## 2013-12-01 NOTE — Telephone Encounter (Signed)
Printed and signed.  

## 2013-12-28 ENCOUNTER — Telehealth: Payer: Self-pay

## 2013-12-28 DIAGNOSIS — M5137 Other intervertebral disc degeneration, lumbosacral region: Secondary | ICD-10-CM

## 2013-12-28 MED ORDER — HYDROCODONE-ACETAMINOPHEN 10-325 MG PO TABS: 1.0000 | ORAL_TABLET | Freq: Four times a day (QID) | ORAL | Status: DC | PRN

## 2013-12-28 MED ORDER — HYDROCODONE-ACETAMINOPHEN 10-325 MG PO TABS: ORAL_TABLET | ORAL | Status: DC

## 2013-12-28 NOTE — Telephone Encounter (Signed)
PT IN NEED OF HER HYDROCODONE. PLEASE CALL [phone] WHEN READY FOR PICK UP

## 2013-12-28 NOTE — Telephone Encounter (Signed)
Meds ordered this encounter  Medications  . HYDROcodone-acetaminophen (NORCO) 10-325 MG per tablet    Sig: TAKE ONE TABLET BY MOUTH EVERY 6 HOURS AS NEEDED FOR PAIN.    Dispense:  120 tablet    Refill:  0    Order Specific Question:  Supervising Provider    Answer:  DOOLITTLE, ROBERT P [[id number]]  . HYDROcodone-acetaminophen (NORCO) 10-325 MG per tablet    Sig: Take 1 tablet by mouth every 6 (six) hours as needed (pain). May fill 30 days after date on prescription    Dispense:  120 tablet    Refill:  0    Order Specific Question:  Supervising Provider    Answer:  DOOLITTLE, ROBERT P [[id number]]  . HYDROcodone-acetaminophen (NORCO) 10-325 MG per tablet    Sig: Take 1 tablet by mouth every 6 (six) hours as needed (pain). May fill 60 days after date on prescription    Dispense:  120 tablet    Refill:  0    Order Specific Question:  Supervising Provider    Answer:  DOOLITTLE, ROBERT P [[id number]]   Rx's printed for next 3 months.  Please advise patient that she needs to follow-up with me for the next 3 (in July).

## 2013-12-29 NOTE — Telephone Encounter (Signed)
Notified pt Rxs ready on VM and need for f/up by July 15th for more RFs.

## 2014-02-21 LAB — HM COLONOSCOPY

## 2014-03-08 ENCOUNTER — Ambulatory Visit (INDEPENDENT_AMBULATORY_CARE_PROVIDER_SITE_OTHER): Payer: PRIVATE HEALTH INSURANCE | Admitting: Physician Assistant

## 2014-03-08 ENCOUNTER — Encounter: Payer: Self-pay | Admitting: Physician Assistant

## 2014-03-08 VITALS — BP 138/88 | HR 71 | Temp 98.9°F | Resp 16 | Ht 61.25 in | Wt 135.8 lb

## 2014-03-08 DIAGNOSIS — L299 Pruritus, unspecified: Secondary | ICD-10-CM

## 2014-03-08 DIAGNOSIS — G4700 Insomnia, unspecified: Secondary | ICD-10-CM

## 2014-03-08 DIAGNOSIS — I739 Peripheral vascular disease, unspecified: Secondary | ICD-10-CM

## 2014-03-08 DIAGNOSIS — E785 Hyperlipidemia, unspecified: Secondary | ICD-10-CM

## 2014-03-08 DIAGNOSIS — Z124 Encounter for screening for malignant neoplasm of cervix: Secondary | ICD-10-CM

## 2014-03-08 DIAGNOSIS — F32A Depression, unspecified: Secondary | ICD-10-CM

## 2014-03-08 DIAGNOSIS — I10 Essential (primary) hypertension: Secondary | ICD-10-CM

## 2014-03-08 DIAGNOSIS — F3289 Other specified depressive episodes: Secondary | ICD-10-CM

## 2014-03-08 DIAGNOSIS — I779 Disorder of arteries and arterioles, unspecified: Secondary | ICD-10-CM

## 2014-03-08 DIAGNOSIS — M5137 Other intervertebral disc degeneration, lumbosacral region: Secondary | ICD-10-CM

## 2014-03-08 DIAGNOSIS — Z0000 Encounter for general adult medical examination without abnormal findings: Secondary | ICD-10-CM

## 2014-03-08 DIAGNOSIS — M51379 Other intervertebral disc degeneration, lumbosacral region without mention of lumbar back pain or lower extremity pain: Secondary | ICD-10-CM

## 2014-03-08 DIAGNOSIS — Z1239 Encounter for other screening for malignant neoplasm of breast: Secondary | ICD-10-CM

## 2014-03-08 DIAGNOSIS — F329 Major depressive disorder, single episode, unspecified: Secondary | ICD-10-CM

## 2014-03-08 LAB — COMPLETE METABOLIC PANEL WITH GFR
ALT: 20 U/L (ref 0–35)
AST: 23 U/L (ref 0–37)
Albumin: 4.6 g/dL (ref 3.5–5.2)
Alkaline Phosphatase: 79 U/L (ref 39–117)
BUN: 11 mg/dL (ref 6–23)
CALCIUM: 9.8 mg/dL (ref 8.4–10.5)
CO2: 27 mEq/L (ref 19–32)
Chloride: 103 mEq/L (ref 96–112)
Creat: 0.65 mg/dL (ref 0.50–1.10)
GFR, Est African American: >89 mL/min
GFR, Est Non African American: >89 mL/min
GLUCOSE: 87 mg/dL (ref 70–99)
Potassium: 4.7 mEq/L (ref 3.5–5.3)
Sodium: 138 mEq/L (ref 135–145)
Total Bilirubin: 0.5 mg/dL (ref 0.2–1.2)
Total Protein: 7.2 g/dL (ref 6.0–8.3)

## 2014-03-08 LAB — CBC WITH DIFFERENTIAL/PLATELET
Basophils Absolute: 0 10*3/uL (ref 0.0–0.1)
Basophils Relative: 0 % (ref 0–1)
Eosinophils Absolute: 0.2 10*3/uL (ref 0.0–0.7)
Eosinophils Relative: 3 % (ref 0–5)
HCT: 40.6 % (ref 36.0–46.0)
HEMOGLOBIN: 14 g/dL (ref 12.0–15.0)
LYMPHS PCT: 32 % (ref 12–46)
Lymphs Abs: 2.4 10*3/uL (ref 0.7–4.0)
MCH: 31.2 pg (ref 26.0–34.0)
MCHC: 34.5 g/dL (ref 30.0–36.0)
MCV: 90.4 fL (ref 78.0–100.0)
Monocytes Absolute: 0.8 10*3/uL (ref 0.1–1.0)
Monocytes Relative: 10 % (ref 3–12)
NEUTROS ABS: 4.2 10*3/uL (ref 1.7–7.7)
Neutrophils Relative %: 55 % (ref 43–77)
Platelets: 292 10*3/uL (ref 150–400)
RBC: 4.49 MIL/uL (ref 3.87–5.11)
RDW: 13.9 % (ref 11.5–15.5)
WBC: 7.6 10*3/uL (ref 4.0–10.5)

## 2014-03-08 LAB — TSH: TSH: 1.429 u[IU]/mL (ref 0.350–4.500)

## 2014-03-08 LAB — LIPID PANEL
CHOLESTEROL: 224 mg/dL — AB (ref 0–200)
HDL: 49 mg/dL (ref >39)
LDL Cholesterol: 138 mg/dL — ABNORMAL HIGH (ref 0–99)
Total CHOL/HDL Ratio: 4.6 Ratio
Triglycerides: 185 mg/dL — ABNORMAL HIGH (ref <150)
VLDL: 37 mg/dL (ref 0–40)

## 2014-03-08 MED ORDER — PRAVASTATIN SODIUM 40 MG PO TABS: 40.0000 mg | ORAL_TABLET | Freq: Every day | ORAL | Status: DC

## 2014-03-08 MED ORDER — HYDROXYZINE HCL 25 MG PO TABS: 12.5000 mg | ORAL_TABLET | Freq: Every evening | ORAL | Status: DC | PRN

## 2014-03-08 MED ORDER — METOPROLOL SUCCINATE ER 25 MG PO TB24: ORAL_TABLET | ORAL | Status: DC

## 2014-03-08 MED ORDER — HYDROCODONE-ACETAMINOPHEN 10-325 MG PO TABS: ORAL_TABLET | ORAL | Status: DC

## 2014-03-08 MED ORDER — HYDROCODONE-ACETAMINOPHEN 10-325 MG PO TABS: 1.0000 | ORAL_TABLET | Freq: Four times a day (QID) | ORAL | Status: DC | PRN

## 2014-03-08 MED ORDER — BUPROPION HCL ER (XL) 450 MG PO TB24: ORAL_TABLET | ORAL | Status: DC

## 2014-03-08 NOTE — Progress Notes (Signed)
Subjective:    Patient ID: Jamie Walters, female    DOB: December 30, 1960, 53 y.o.   MRN: [id number]   PCP: Jamie Walters  ChiChucky Mayef Complaint  Patient presents with  . Annual Exam      Active Ambulatory Problems    Diagnosis Date Noted  . HTN (hypertension) 12/03/2012  . Hyperlipidemia 12/03/2012  . Mild carotid artery disease 12/03/2012  . DDD (degenerative disc disease), lumbosacral 12/03/2012  . Depression 12/03/2012  . HA (headache) 12/03/2012   Resolved Ambulatory Problems    Diagnosis Date Noted  . No Resolved Ambulatory Problems   Past Medical History  Diagnosis Date  . Arthritis   . Hypertension   . Allergy     Past Surgical History  Procedure Laterality Date  . Tubal ligation      No Known Allergies  Prior to Admission medications   Medication Sig Start Date End Date Taking? Authorizing Provider  aspirin 81 MG tablet Take 81 mg by mouth daily.   Yes Historical Provider, MD  buPROPion (WELLBUTRIN XL) 300 MG 24 hr tablet TAKE ONE TABLET BY MOUTH IN THE MORNING 08/19/13  Yes Shantara Goosby S Tomie Spizzirri, Walters  HYDROcodone-acetaminophen (NORCO) 10-325 MG per tablet TAKE ONE TABLET BY MOUTH EVERY 6 HOURS AS NEEDED FOR PAIN. 12/28/13  Yes Natash Berman S Karene Bracken, Walters  HYDROcodone-acetaminophen (NORCO) 10-325 MG per tablet Take 1 tablet by mouth every 6 (six) hours as needed (pain). May fill 30 days after date on prescription 12/28/13  Yes Clarece Drzewiecki S Emmaline Wahba, Walters  HYDROcodone-acetaminophen (NORCO) 10-325 MG per tablet Take 1 tablet by mouth every 6 (six) hours as needed (pain). May fill 60 days after date on prescription 12/28/13  Yes Keylon Labelle S Caellum Mancil, Walters  hydrOXYzine (ATARAX/VISTARIL) 25 MG tablet Take 0.5-2 tablets (12.5-50 mg total) by mouth at bedtime as needed for anxiety or itching (insomnia). 08/19/13  Yes Ashayla Subia S Melani Brisbane, Walters  metoprolol succinate (TOPROL-XL) 25 MG 24 hr tablet TAKE ONE TABLET BY MOUTH ONCE DAILY 08/19/13  Yes Deysi Soldo S Keilly Fatula, Walters  pravastatin (PRAVACHOL)  40 MG tablet Take 1 tablet (40 mg total) by mouth daily. 08/19/13  Yes Rosena Bartle S Rayburn Mundis, Walters  varenicline (CHANTIX) 1 MG tablet Take 1 tablet (1 mg total) by mouth 2 (two) times daily. Use for smoking cessation. 12/03/12   Fernande Brashelle S Jezreel Sisk, Walters    History   Social History  . Marital Status: Divorced    Spouse Name: [name]    Number of Children: 2  . Years of Education: 11   Occupational History  . Lead Inspector-supervisor    Social History Main Topics  . Smoking status: Current Every Day Smoker    Types: Cigarettes  . Smokeless tobacco: Never Used     Comment: "im really trying." Interested in Chantix.  Marland Kitchen. Alcohol Use: No  . Drug Use: No  . Sexual Activity: Yes    Partners: Male    Birth Control/ Protection: Post-menopausal   Other Topics Concern  . None   Social History Narrative   Lives with her common law husband. Her son lives in [place], after returning from [employer] [place]. Her daughter, [name], [place] [address] [place] the grandchildren, in 05/2013.  Her mother died 02/2013.    family history includes Alcohol abuse in her father; Arthritis in her father; COPD in her brother, brother, and father; Cancer in her father; Hyperlipidemia in her mother; Hypertension in her mother and sister; Mental illness in her son; Stroke in her mother. indicated  that her mother is deceased. She indicated that her father is deceased. She indicated that her sister is alive. She indicated that all of her three brothers are alive. She indicated that her daughter is alive. She indicated that her son is alive.   HPI  Presents for annual exam and follow-up on chronic medical problems and refills. Last mammogram 2012. Last pap 2012, normal. Colonoscopy 02/21/2014 with Dr. Ewing Schlein revealed 2 small polyps, and she's advised to repeat in 5 years.  She feels depressed still.  The Wellbutrin has helped, but she is still sad and weepy.  Her grandchildren are both with their mother, her daughter,  [place]  During the school year her grandson, [name], lives here with his father and she gets to see him regularly.  It's especially hard to have them both gone for the summer.  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  Additionally, the facility has no AC, and temperatures are well above 100 degrees daily. She's exhausted, and falls asleep promptly in the evenings.  "I have to make sure I've done everything before I sit down," since she knows she'll fall asleep as soon as she's still.  Review of Systems  Constitutional: Negative.   HENT: Negative.   Eyes: Negative.   Respiratory: Negative.   Cardiovascular: Negative.   Gastrointestinal: Negative.   Endocrine: Negative.   Genitourinary: Negative.   Musculoskeletal: Negative.   Skin: Negative.   Allergic/Immunologic: Negative.   Neurological: Negative.   Hematological: Negative.   Psychiatric/Behavioral: Positive for dysphoric mood. Negative for suicidal ideas, hallucinations, behavioral problems, confusion, sleep disturbance ("I sleep too good"), self-injury, decreased concentration and agitation. The patient is nervous/anxious. The patient is not hyperactive.        Objective:   Physical Exam  Vitals reviewed. Constitutional: She is oriented to person, place, and time. Vital signs are normal. She appears well-developed and well-nourished. She is active and cooperative. No distress.  BP 138/88  Pulse 71  Temp(Src) 98.9 F (37.2 C) (Oral)  Resp 16  Ht 5' 1.25" (1.556 m)  Wt 135 lb 12.8 oz (61.598 kg)  BMI 25.44 kg/m2  SpO2 97%   HENT:  Head: Normocephalic and atraumatic.  Right Ear: Hearing, tympanic membrane, external ear and ear canal normal. No foreign bodies.  Left Ear: Hearing, tympanic membrane, external ear and ear canal normal. No foreign bodies.  Nose: Nose normal.  Mouth/Throat: Uvula is midline, oropharynx is clear and moist and mucous membranes are  normal. No oral lesions. Normal dentition. No dental abscesses or uvula swelling. No oropharyngeal exudate.  Eyes: Conjunctivae, EOM and lids are normal. Pupils are equal, round, and reactive to light. Right eye exhibits no discharge. Left eye exhibits no discharge. No scleral icterus.  Fundoscopic exam:      The right eye shows no arteriolar narrowing, no AV nicking, no exudate, no hemorrhage and no papilledema.       The left eye shows no arteriolar narrowing, no AV nicking, no exudate, no hemorrhage and no papilledema.  Neck: Trachea normal, normal range of motion and full passive range of motion without pain. Neck supple. No spinous process tenderness and no muscular tenderness present. No mass and no thyromegaly present.  Cardiovascular: Normal rate, regular rhythm, normal heart sounds, intact distal pulses and normal pulses.   Pulmonary/Chest: Effort normal and breath sounds normal. She exhibits no tenderness and no retraction. Right breast exhibits no inverted nipple, no  mass, no nipple discharge, no skin change and no tenderness. Left breast exhibits no inverted nipple, no mass, no nipple discharge, no skin change and no tenderness. Breasts are symmetrical.  Abdominal: Soft. Normal appearance and bowel sounds are normal. She exhibits no distension and no mass. There is no hepatosplenomegaly. There is no tenderness. There is no rigidity, no rebound, no guarding, no CVA tenderness, no tenderness at McBurney's point and negative Murphy's sign. No hernia. Hernia confirmed negative in the right inguinal area and confirmed negative in the left inguinal area.  Genitourinary: Rectum normal, vagina normal and uterus normal. Rectal exam shows no external hemorrhoid and no fissure. No breast swelling, tenderness, discharge or bleeding. Pelvic exam was performed with patient supine. No labial fusion. There is no rash, tenderness, lesion or injury on the right labia. There is no rash, tenderness, lesion or  injury on the left labia. Cervix exhibits no motion tenderness, no discharge and no friability. Right adnexum displays no mass, no tenderness and no fullness. Left adnexum displays no mass, no tenderness and no fullness. No erythema, tenderness or bleeding around the vagina. No foreign body around the vagina. No signs of injury around the vagina. No vaginal discharge found.  Musculoskeletal: She exhibits no edema.       Cervical back: Normal.       Thoracic back: Normal.       Lumbar back: She exhibits tenderness and bony tenderness. She exhibits normal range of motion, no swelling and no edema.  Tenderness of the lower lumbar spine and paraspinous muscles into the buttocks bilaterally.  Lymphadenopathy:       Head (right side): No tonsillar, no preauricular, no posterior auricular and no occipital adenopathy present.       Head (left side): No tonsillar, no preauricular, no posterior auricular and no occipital adenopathy present.    She has no cervical adenopathy.    She has no axillary adenopathy.       Right: No inguinal and no supraclavicular adenopathy present.       Left: No inguinal and no supraclavicular adenopathy present.  Neurological: She is alert and oriented to person, place, and time. She has normal strength and normal reflexes. No cranial nerve deficit. She exhibits normal muscle tone. Coordination and gait normal.  Skin: Skin is warm, dry and intact. No rash noted. She is not diaphoretic. No cyanosis or erythema. Nails show no clubbing.  Psychiatric: She has a normal mood and affect. Her speech is normal and behavior is normal. Judgment and thought content normal.          Assessment & Plan:  1. Annual physical exam Age appropriate anticipatory guidance provided.  2. Essential hypertension Controlled. Continue current regimen. - CBC with Differential - COMPLETE METABOLIC PANEL WITH GFR - TSH - metoprolol succinate (TOPROL-XL) 25 MG 24 hr tablet; TAKE ONE TABLET BY  MOUTH ONCE DAILY  Dispense: 30 tablet; Refill: 5  3. Hyperlipidemia Await labs.  Continue current regimen. - Lipid panel - pravastatin (PRAVACHOL) 40 MG tablet; Take 1 tablet (40 mg total) by mouth daily.  Dispense: 30 tablet; Refill: 5  4. Screening for cervical cancer If pap and HPV both negative, repeat both in 5 years. - Pap IG and HPV (high risk) DNA detection  5. Screening for breast cancer Patient [facility].   - MM Digital Screening; Future  6. DDD (degenerative disc disease), lumbosacral Stable.  3 months' prescriptions provided.  She can call for another  3 months' and the follow-up with me in 6 months. - HYDROcodone-acetaminophen (NORCO) 10-325 MG per tablet; TAKE ONE TABLET BY MOUTH EVERY 6 HOURS AS NEEDED FOR PAIN.  Dispense: 120 tablet; Refill: 0  7. Depression Increase Wellbutrin XL to 450 mg.  Contact me if it's inadequate to control her symptoms. - buPROPion 450 MG TB24; TAKE ONE TABLET BY MOUTH IN THE MORNING for treatment of headache and depression  Dispense: 30 tablet; Refill: 5  8. Mild carotid artery disease Continue ASA.  9. Itching 10. Insomnia - hydrOXYzine (ATARAX/VISTARIL) 25 MG tablet; Take 0.5-2 tablets (12.5-50 mg total) by mouth at bedtime as needed for anxiety or itching (insomnia).  Dispense: 60 tablet; Refill: 1  Return in about 6 months (around 09/07/2014) for re-evaluation of blood pressure, cholesterol, etc..  Fernande Bras, Walters Physician Assistant-Certified [facility]

## 2014-03-08 NOTE — Patient Instructions (Signed)
I will contact you with your lab results as soon as they are available.   If you have not heard from me in 2 weeks, please contact me.  The fastest way to get your results is to register for My Chart (see the instructions on the last page of this printout).  Keeping You Healthy  Get These Tests  Blood Pressure- Have your blood pressure checked by your healthcare provider at least once a year.  Normal blood pressure is 120/80.  Weight- Have your body mass index (BMI) calculated to screen for obesity.  BMI is a measure of body fat based on height and weight.  You can calculate your own BMI at www.nhlbisupport.com/bmi/  Cholesterol- Have your cholesterol checked every year.  Diabetes- Have your blood sugar checked every year if you have high blood pressure, high cholesterol, a family history of diabetes or if you are overweight.  Pap Smear- Have a pap smear every 1 to 5 years if you have been sexually active.  If you are older than 65 and recent pap smears have been normal you may not need additional pap smears.  In addition, if you have had a hysterectomy  For benign disease additional pap smears are not necessary.  Mammogram-Yearly mammograms are essential for early detection of breast cancer  Screening for Colon Cancer- Colonoscopy starting at age 50. Screening may begin sooner depending on your family history and other health conditions.  Follow up colonoscopy as directed by your Gastroenterologist.  Screening for Osteoporosis- Screening begins at age 65 with bone density scanning, sooner if you are at higher risk for developing Osteoporosis.  Get these medicines  Calcium with Vitamin D- Your body requires 1200-1500 mg of Calcium a day and 800-1000 IU of Vitamin D a day.  You can only absorb 500 mg of Calcium at a time therefore Calcium must be taken in 2 or 3 separate doses throughout the day.  Hormones- Hormone therapy has been associated with increased risk for certain cancers and  heart disease.  Talk to your healthcare provider about if you need relief from menopausal symptoms.  Aspirin- Ask your healthcare provider about taking Aspirin to prevent Heart Disease and Stroke.  Get these Immuniztions  Flu shot- Every fall  Pneumonia shot- Once after the age of 65; if you are younger ask your healthcare provider if you need a pneumonia shot.  Tetanus- Every ten years.  Zostavax- Once after the age of 60 to prevent shingles.  Take these steps  Don't smoke- Your healthcare provider can help you quit. For tips on how to quit, ask your healthcare provider or go to www.smokefree.gov or call 1-800 QUIT-NOW.  Be physically active- Exercise 5 days a week for a minimum of 30 minutes.  If you are not already physically active, start slow and gradually work up to 30 minutes of moderate physical activity.  Try walking, dancing, bike riding, swimming, etc.  Eat a healthy diet- Eat a variety of healthy foods such as fruits, vegetables, whole grains, low fat milk, low fat cheeses, yogurt, lean meats, chicken, fish, eggs, dried beans, tofu, etc.  For more information go to www.thenutritionsource.org  Dental visit- Brush and floss teeth twice daily; visit your dentist twice a year.  Eye exam- Visit your Optometrist or Ophthalmologist yearly.  Drink alcohol in moderation- Limit alcohol intake to one drink or less a day.  Never drink and drive.  Depression- Your emotional health is as important as your physical health.  If you're feeling   down or losing interest in things you normally enjoy, please talk to your healthcare provider.  Seat Belts- can save your life; always wear one  Smoke/Carbon Monoxide detectors- These detectors need to be installed on the appropriate level of your home.  Replace batteries at least once a year.  Violence- If anyone is threatening or hurting you, please tell your healthcare provider.  Living Will/ Health care power of attorney- Discuss with your  healthcare provider and family. 

## 2014-03-09 ENCOUNTER — Encounter: Payer: Self-pay | Admitting: Physician Assistant

## 2014-03-09 DIAGNOSIS — K635 Polyp of colon: Secondary | ICD-10-CM | POA: Insufficient documentation

## 2014-03-10 ENCOUNTER — Telehealth: Payer: Self-pay

## 2014-03-10 LAB — PAP IG AND HPV HIGH-RISK: HPV DNA HIGH RISK: NOT DETECTED

## 2014-03-10 MED ORDER — BUPROPION HCL ER (XL) 150 MG PO TB24: 450.0000 mg | ORAL_TABLET | Freq: Every day | ORAL | Status: DC

## 2014-03-10 NOTE — Telephone Encounter (Signed)
Jamie Walters,   Patient states the buPROPion 450 MG TB24 is too expensive it used to be only $13, now over $100  She thinks the change to include the headache is the culprit.   [phone]

## 2014-03-10 NOTE — Telephone Encounter (Signed)
Ok, I sent a new one in for the 150 mg, with instructions to take 3 each morning.  I took out the HA part.

## 2014-03-11 ENCOUNTER — Telehealth: Payer: Self-pay | Admitting: *Deleted

## 2014-03-11 ENCOUNTER — Encounter: Payer: Self-pay | Admitting: Physician Assistant

## 2014-03-11 MED ORDER — EZETIMIBE 10 MG PO TABS: 10.0000 mg | ORAL_TABLET | Freq: Every day | ORAL | Status: DC

## 2014-03-11 NOTE — Telephone Encounter (Signed)
See other phone message  

## 2014-03-11 NOTE — Telephone Encounter (Signed)
PATIENT SAYS MEDICATION IS NOW $164.  SHE WANTS [name] TO TAKE OUT THE HEADACHE AND DEPRESSION PART SO THAT HER PRESCRIPTION WILL BE THE $13 THAT SHE NORMALLY PAYS.

## 2014-03-11 NOTE — Telephone Encounter (Signed)
Pt called again and said the pharmacy is saying her medication is now $210. She ask that [name] write Rx the way she did 6 months ago.

## 2014-03-11 NOTE — Telephone Encounter (Signed)
Dana from [facility] called again. Pt insurance will pay for Wellbutrin 300mg  tablet. VO from [name] to change rx to 300mg  tablet- directions to take 1 and 1 half tablet daily.

## 2014-03-11 NOTE — Addendum Note (Signed)
Addended by: Fernande BrasJEFFERY, [name] S on: 03/11/2014 08:23 AM   Modules accepted: Orders

## 2014-03-22 LAB — HM MAMMOGRAPHY

## 2014-03-31 ENCOUNTER — Encounter: Payer: Self-pay | Admitting: *Deleted

## 2014-06-27 ENCOUNTER — Telehealth: Payer: Self-pay

## 2014-06-27 DIAGNOSIS — M5137 Other intervertebral disc degeneration, lumbosacral region: Secondary | ICD-10-CM

## 2014-06-27 NOTE — Telephone Encounter (Signed)
Pt called to request a refill on pain medicine, she is requesting a 2 month supply.  pts December appt with [name] has been cancelled due to a closed clinic, but will come in to see her in the walkin clinic in December

## 2014-06-29 MED ORDER — HYDROCODONE-ACETAMINOPHEN 10-325 MG PO TABS: 1.0000 | ORAL_TABLET | Freq: Four times a day (QID) | ORAL | Status: DC | PRN

## 2014-06-29 MED ORDER — HYDROCODONE-ACETAMINOPHEN 10-325 MG PO TABS: ORAL_TABLET | ORAL | Status: DC

## 2014-06-29 NOTE — Telephone Encounter (Signed)
Rxs printed.  Meds ordered this encounter  Medications  . HYDROcodone-acetaminophen (NORCO) 10-325 MG per tablet    Sig: TAKE ONE TABLET BY MOUTH EVERY 6 HOURS AS NEEDED FOR PAIN.    Dispense:  120 tablet    Refill:  0    Order Specific Question:  Supervising Provider    Answer:  DOOLITTLE, ROBERT P [[id number]]  . HYDROcodone-acetaminophen (NORCO) 10-325 MG per tablet    Sig: Take 1 tablet by mouth every 6 (six) hours as needed (pain). May fill 30 days after date on prescription    Dispense:  120 tablet    Refill:  0    Order Specific Question:  Supervising Provider    Answer:  DOOLITTLE, ROBERT P [[id number]]  . HYDROcodone-acetaminophen (NORCO) 10-325 MG per tablet    Sig: Take 1 tablet by mouth every 6 (six) hours as needed (pain). May fill 60 days after date on prescription    Dispense:  120 tablet    Refill:  0    Order Specific Question:  Supervising Provider    Answer:  DOOLITTLE, ROBERT P [[id number]]

## 2014-06-29 NOTE — Telephone Encounter (Signed)
Patient calling to check the status on her pain medicine being refilled. I informed patient her refill request she submitted Monday is still being processed. Please call patient at [phone] when ready to be picked up.

## 2014-06-29 NOTE — Telephone Encounter (Signed)
LMOM that this is ready for p/u 

## 2014-08-30 ENCOUNTER — Ambulatory Visit: Payer: PRIVATE HEALTH INSURANCE | Admitting: Physician Assistant

## 2014-09-10 ENCOUNTER — Other Ambulatory Visit: Payer: Self-pay | Admitting: Physician Assistant

## 2014-09-20 ENCOUNTER — Telehealth: Payer: Self-pay

## 2014-09-20 ENCOUNTER — Encounter: Payer: Self-pay | Admitting: Physician Assistant

## 2014-09-20 ENCOUNTER — Ambulatory Visit (INDEPENDENT_AMBULATORY_CARE_PROVIDER_SITE_OTHER): Payer: PRIVATE HEALTH INSURANCE | Admitting: Physician Assistant

## 2014-09-20 VITALS — BP 130/80 | HR 74 | Temp 98.1°F | Resp 16 | Ht 61.5 in | Wt 134.0 lb

## 2014-09-20 DIAGNOSIS — M542 Cervicalgia: Secondary | ICD-10-CM

## 2014-09-20 DIAGNOSIS — G4700 Insomnia, unspecified: Secondary | ICD-10-CM

## 2014-09-20 DIAGNOSIS — M5137 Other intervertebral disc degeneration, lumbosacral region: Secondary | ICD-10-CM

## 2014-09-20 DIAGNOSIS — F32A Depression, unspecified: Secondary | ICD-10-CM

## 2014-09-20 DIAGNOSIS — I10 Essential (primary) hypertension: Secondary | ICD-10-CM

## 2014-09-20 DIAGNOSIS — L299 Pruritus, unspecified: Secondary | ICD-10-CM

## 2014-09-20 DIAGNOSIS — F329 Major depressive disorder, single episode, unspecified: Secondary | ICD-10-CM

## 2014-09-20 DIAGNOSIS — E785 Hyperlipidemia, unspecified: Secondary | ICD-10-CM

## 2014-09-20 MED ORDER — BUPROPION HCL ER (XL) 300 MG PO TB24: ORAL_TABLET | ORAL | Status: DC

## 2014-09-20 MED ORDER — HYDROCODONE-ACETAMINOPHEN 10-325 MG PO TABS: 1.0000 | ORAL_TABLET | Freq: Four times a day (QID) | ORAL | Status: DC | PRN

## 2014-09-20 MED ORDER — PRAVASTATIN SODIUM 40 MG PO TABS: 40.0000 mg | ORAL_TABLET | Freq: Every day | ORAL | Status: DC

## 2014-09-20 MED ORDER — HYDROXYZINE HCL 25 MG PO TABS: 12.5000 mg | ORAL_TABLET | Freq: Every evening | ORAL | Status: DC | PRN

## 2014-09-20 MED ORDER — HYDROCODONE-ACETAMINOPHEN 10-325 MG PO TABS: ORAL_TABLET | ORAL | Status: DC

## 2014-09-20 MED ORDER — METOPROLOL SUCCINATE ER 25 MG PO TB24: 25.0000 mg | ORAL_TABLET | Freq: Every day | ORAL | Status: DC

## 2014-09-20 NOTE — Patient Instructions (Signed)
Keep up the great work- Send me a copy of you recent labs from work.

## 2014-09-20 NOTE — Telephone Encounter (Signed)
Please advise on muscle relaxer

## 2014-09-20 NOTE — Progress Notes (Signed)
Subjective:    Patient ID: Jamie Walters, female    DOB: Mar 04, 1961, 54 y.o.   MRN: [id number]   PCP: Persephanie Laatsch, [name]  Chief Complaint  Patient presents with  . Medication Refill    Wellbutrin, Norco, Atarax, Toprol, Pravachol  . Neck Pain    x 3 days    No Known Allergies  Patient Active Problem List   Diagnosis Date Noted  . Hyperplastic colon polyp 03/09/2014  . HTN (hypertension) 12/03/2012  . Hyperlipidemia 12/03/2012  . Mild carotid artery disease 12/03/2012  . DDD (degenerative disc disease), lumbosacral 12/03/2012  . Depression 12/03/2012  . HA (headache) 12/03/2012    Prior to Admission medications   Medication Sig Start Date End Date Taking? Authorizing Provider  aspirin 81 MG tablet Take 81 mg by mouth daily.   Yes Historical Provider, MD  buPROPion (WELLBUTRIN XL) 150 MG 24 hr tablet Take 3 tablets (450 mg total) by mouth daily. For depression. 03/10/14  Yes Ammar Moffatt S Whitley Patchen, [name]  HYDROcodone-acetaminophen (NORCO) 10-325 MG per tablet TAKE ONE TABLET BY MOUTH EVERY 6 HOURS AS NEEDED FOR PAIN. 06/29/14  Yes Veronica Guerrant S Draven Natter, [name]  HYDROcodone-acetaminophen (NORCO) 10-325 MG per tablet Take 1 tablet by mouth every 6 (six) hours as needed (pain). May fill 30 days after date on prescription 06/29/14  Yes Guneet Delpino S Eusebia Grulke, [name]  HYDROcodone-acetaminophen (NORCO) 10-325 MG per tablet Take 1 tablet by mouth every 6 (six) hours as needed (pain). May fill 60 days after date on prescription 06/29/14  Yes Sidrah Harden S Saachi Zale, [name]  hydrOXYzine (ATARAX/VISTARIL) 25 MG tablet Take 0.5-2 tablets (12.5-50 mg total) by mouth at bedtime as needed for anxiety or itching (insomnia). 03/08/14  Yes Cathline Dowen S Jakyren Fluegge, [name]  metoprolol succinate (TOPROL-XL) 25 MG 24 hr tablet Take 1 tablet (25 mg total) by mouth daily. PATIENT NEEDS OFFICE VISIT FOR ADDITIONAL REFILLS 09/12/14  Yes Andilyn Bettcher S Nathanial Arrighi, [name]  pravastatin (PRAVACHOL) 40 MG tablet Take 1 tablet (40 mg total) by mouth daily.  03/08/14  Yes Gibran Veselka S Essence Merle, [name]  varenicline (CHANTIX) 1 MG tablet Take 1 tablet (1 mg total) by mouth 2 (two) times daily. Use for smoking cessation. Patient not taking: Reported on 09/20/2014 12/03/12   Fernande Brashelle S Shristi Scheib, [name]    Medical, Surgical, Family and Social History reviewed and updated.  HPI  Presents for refill of her regular medications, lab update. Overall, she's doing well, as before. Tolerating her medications without adverse effects, and feels like they are controlling her symptoms well enough. Now is having RIGHT sided neck pain, like she slept wrong. No pain radiating down the arm, no weakness or paresthesias. Pain with bending and rotating the neck.  Review of Systems As above.    Objective:   Physical Exam  Constitutional: She is oriented to person, place, and time. Vital signs are normal. She appears well-developed and well-nourished. She is active and cooperative. No distress.  BP 130/80 mmHg  Pulse 74  Temp(Src) 98.1 F (36.7 C) (Oral)  Resp 16  Ht 5' 1.5" (1.562 m)  Wt 134 lb (60.782 kg)  BMI 24.91 kg/m2  SpO2 98%  HENT:  Head: Normocephalic and atraumatic.  Right Ear: Hearing normal.  Left Ear: Hearing normal.  Eyes: Conjunctivae are normal. No scleral icterus.  Neck: Normal range of motion. Neck supple. No thyromegaly present.    Cardiovascular: Normal rate, regular rhythm and normal heart sounds.   Pulses:      Radial pulses are 2+ on the right  side, and 2+ on the left side.  Pulmonary/Chest: Effort normal and breath sounds normal.  Musculoskeletal:       Cervical back: Normal.       Thoracic back: Normal.       Lumbar back: She exhibits tenderness.  Lymphadenopathy:       Head (right side): No tonsillar, no preauricular, no posterior auricular and no occipital adenopathy present.       Head (left side): No tonsillar, no preauricular, no posterior auricular and no occipital adenopathy present.    She has no cervical adenopathy.        Right: No supraclavicular adenopathy present.       Left: No supraclavicular adenopathy present.  Neurological: She is alert and oriented to person, place, and time. She has normal strength. No cranial nerve deficit or sensory deficit.  Reflex Scores:      Patellar reflexes are 2+ on the right side and 2+ on the left side. Skin: Skin is warm, dry and intact. No rash noted. No cyanosis or erythema. Nails show no clubbing.  Psychiatric: She has a normal mood and affect.   I drew labs on her in June. She's recently had some done at work, but didn't bring results with her.  Will send tham to me.       Assessment & Plan:  1. Hyperlipidemia Continue current treatment. Fasting labs at next visit. - pravastatin (PRAVACHOL) 40 MG tablet; Take 1 tablet (40 mg total) by mouth daily.  Dispense: 30 tablet; Refill: 5  2. Itching Stable. - hydrOXYzine (ATARAX/VISTARIL) 25 MG tablet; Take 0.5-2 tablets (12.5-50 mg total) by mouth at bedtime as needed for anxiety or itching (insomnia).  Dispense: 60 tablet; Refill: 1  3. Insomnia Stable. - hydrOXYzine (ATARAX/VISTARIL) 25 MG tablet; Take 0.5-2 tablets (12.5-50 mg total) by mouth at bedtime as needed for anxiety or itching (insomnia).  Dispense: 60 tablet; Refill: 1  4. DDD (degenerative disc disease), lumbosacral Stable. - HYDROcodone-acetaminophen (NORCO) 10-325 MG per tablet; Take 1 tablet by mouth every 6 (six) hours as needed (pain). May fill 60 days after date on prescription  Dispense: 120 tablet; Refill: 0 - HYDROcodone-acetaminophen (NORCO) 10-325 MG per tablet; Take 1 tablet by mouth every 6 (six) hours as needed (pain). May fill 30 days after date on prescription  Dispense: 120 tablet; Refill: 0 - HYDROcodone-acetaminophen (NORCO) 10-325 MG per tablet; TAKE ONE TABLET BY MOUTH EVERY 6 HOURS AS NEEDED FOR PAIN.  Dispense: 120 tablet; Refill: 0  5. Essential hypertension Controlled.  - metoprolol succinate (TOPROL-XL) 25 MG 24 hr tablet;  Take 1 tablet (25 mg total) by mouth daily.  Dispense: 30 tablet; Refill: 5  6. Depression Stable. - buPROPion (WELLBUTRIN XL) 300 MG 24 hr tablet; Take 1.5 tablets QD for depression and headache.  Dispense: 45 tablet; Refill: 5  7. Neck pain, RIGHT. Add Flexeril at HS.  Heating pad.   Return in about 6 months (around 03/21/2015). Update fasting labs at that time.  Fernande Bras, [name] Physician Assistant-Certified [facility]

## 2014-09-20 NOTE — Telephone Encounter (Signed)
Patient says [name] was supposed to call in a muscle relaxer and it was not at the pharmacy.

## 2014-09-21 MED ORDER — CYCLOBENZAPRINE HCL 10 MG PO TABS: 10.0000 mg | ORAL_TABLET | Freq: Every evening | ORAL | Status: DC | PRN

## 2014-09-21 NOTE — Telephone Encounter (Signed)
LMOM that this was sent to pharm.  

## 2014-09-21 NOTE — Telephone Encounter (Signed)
Thanks for the reminder!  I have sent it now.  Meds ordered this encounter  Medications  . cyclobenzaprine (FLEXERIL) 10 MG tablet    Sig: Take 1 tablet (10 mg total) by mouth at bedtime as needed for muscle spasms.    Dispense:  30 tablet    Refill:  0    Order Specific Question:  Supervising Provider    Answer:  DOOLITTLE, ROBERT P [[id number]]

## 2014-12-21 ENCOUNTER — Telehealth: Payer: Self-pay

## 2014-12-21 DIAGNOSIS — M5137 Other intervertebral disc degeneration, lumbosacral region: Secondary | ICD-10-CM

## 2014-12-21 NOTE — Telephone Encounter (Signed)
Pt requesting refill on HYDROcodone-acetaminophen (NORCO) 10-325 MG per tablet [[id number]][[id number]]

## 2014-12-22 NOTE — Telephone Encounter (Signed)
Patient called to follow on medication refill request. Patient states that she prefers to be called on her cell phone when the prescription is ready for pickup. Patient's cell: [phone]

## 2014-12-23 MED ORDER — HYDROCODONE-ACETAMINOPHEN 10-325 MG PO TABS: ORAL_TABLET | ORAL | Status: DC

## 2014-12-23 MED ORDER — HYDROCODONE-ACETAMINOPHEN 10-325 MG PO TABS: 1.0000 | ORAL_TABLET | Freq: Four times a day (QID) | ORAL | Status: DC | PRN

## 2014-12-23 NOTE — Telephone Encounter (Signed)
Left message letting pt know Rx ready to pick up. 

## 2014-12-23 NOTE — Telephone Encounter (Signed)
Rxs printed.  Meds ordered this encounter  Medications  . HYDROcodone-acetaminophen (NORCO) 10-325 MG per tablet    Sig: Take 1 tablet by mouth every 6 (six) hours as needed (pain). May fill 60 days after date on prescription    Dispense:  120 tablet    Refill:  0    Order Specific Question:  Supervising Provider    Answer:  DOOLITTLE, ROBERT P [[id number]]  . HYDROcodone-acetaminophen (NORCO) 10-325 MG per tablet    Sig: Take 1 tablet by mouth every 6 (six) hours as needed (pain). May fill 30 days after date on prescription    Dispense:  120 tablet    Refill:  0    Order Specific Question:  Supervising Provider    Answer:  DOOLITTLE, ROBERT P [[id number]]  . HYDROcodone-acetaminophen (NORCO) 10-325 MG per tablet    Sig: TAKE ONE TABLET BY MOUTH EVERY 6 HOURS AS NEEDED FOR PAIN.    Dispense:  120 tablet    Refill:  0    Order Specific Question:  Supervising Provider    Answer:  DOOLITTLE, ROBERT P [[id number]]

## 2014-12-30 ENCOUNTER — Telehealth: Payer: Self-pay | Admitting: Family Medicine

## 2014-12-30 NOTE — Telephone Encounter (Signed)
lmom to call and reschedule appt with [name] her clinic is closed on 03/23/15

## 2015-03-13 ENCOUNTER — Other Ambulatory Visit: Payer: Self-pay

## 2015-03-13 DIAGNOSIS — F329 Major depressive disorder, single episode, unspecified: Secondary | ICD-10-CM

## 2015-03-13 DIAGNOSIS — I10 Essential (primary) hypertension: Secondary | ICD-10-CM

## 2015-03-13 DIAGNOSIS — F32A Depression, unspecified: Secondary | ICD-10-CM

## 2015-03-13 DIAGNOSIS — M5137 Other intervertebral disc degeneration, lumbosacral region: Secondary | ICD-10-CM

## 2015-03-13 NOTE — Telephone Encounter (Signed)
Called pt to see which meds she needed refilled. Unable to leave voicemail.

## 2015-03-13 NOTE — Telephone Encounter (Signed)
Pt was scheduled for June and we cancelled her appt,so she is rescheduled for August She is out of all her meds,please refill    Best phone for pt is [phone]    [facility]

## 2015-03-14 NOTE — Telephone Encounter (Signed)
Pt called back and clarified that she needs metoprolol, bupropion, and hydrocodone. Pended for review. [name], please see message below about appt. It looks like pt's last script for hydrocodone was due 02/22/15.

## 2015-03-14 NOTE — Telephone Encounter (Signed)
Left message for pt to call back  °

## 2015-03-16 MED ORDER — BUPROPION HCL ER (XL) 300 MG PO TB24: ORAL_TABLET | ORAL | Status: DC

## 2015-03-16 MED ORDER — HYDROCODONE-ACETAMINOPHEN 10-325 MG PO TABS: ORAL_TABLET | ORAL | Status: DC

## 2015-03-16 MED ORDER — HYDROCODONE-ACETAMINOPHEN 10-325 MG PO TABS: 1.0000 | ORAL_TABLET | Freq: Four times a day (QID) | ORAL | Status: DC | PRN

## 2015-03-16 MED ORDER — METOPROLOL SUCCINATE ER 25 MG PO TB24: 25.0000 mg | ORAL_TABLET | Freq: Every day | ORAL | Status: DC

## 2015-03-16 NOTE — Telephone Encounter (Signed)
Notified pt on Vm Rxs are ready.

## 2015-03-23 ENCOUNTER — Ambulatory Visit: Payer: PRIVATE HEALTH INSURANCE | Admitting: Physician Assistant

## 2015-05-02 ENCOUNTER — Ambulatory Visit (INDEPENDENT_AMBULATORY_CARE_PROVIDER_SITE_OTHER): Payer: PRIVATE HEALTH INSURANCE | Admitting: Physician Assistant

## 2015-05-02 ENCOUNTER — Encounter: Payer: Self-pay | Admitting: Physician Assistant

## 2015-05-02 VITALS — BP 138/72 | HR 73 | Temp 98.1°F | Resp 16 | Ht 62.0 in | Wt 134.4 lb

## 2015-05-02 DIAGNOSIS — Z1239 Encounter for other screening for malignant neoplasm of breast: Secondary | ICD-10-CM

## 2015-05-02 DIAGNOSIS — L299 Pruritus, unspecified: Secondary | ICD-10-CM

## 2015-05-02 DIAGNOSIS — Z114 Encounter for screening for human immunodeficiency virus [HIV]: Secondary | ICD-10-CM

## 2015-05-02 DIAGNOSIS — G4700 Insomnia, unspecified: Secondary | ICD-10-CM

## 2015-05-02 DIAGNOSIS — E785 Hyperlipidemia, unspecified: Secondary | ICD-10-CM | POA: Diagnosis not present

## 2015-05-02 DIAGNOSIS — Z13820 Encounter for screening for osteoporosis: Secondary | ICD-10-CM

## 2015-05-02 DIAGNOSIS — F32A Depression, unspecified: Secondary | ICD-10-CM

## 2015-05-02 DIAGNOSIS — F329 Major depressive disorder, single episode, unspecified: Secondary | ICD-10-CM | POA: Diagnosis not present

## 2015-05-02 DIAGNOSIS — M51379 Other intervertebral disc degeneration, lumbosacral region without mention of lumbar back pain or lower extremity pain: Secondary | ICD-10-CM

## 2015-05-02 DIAGNOSIS — I10 Essential (primary) hypertension: Secondary | ICD-10-CM

## 2015-05-02 DIAGNOSIS — M5137 Other intervertebral disc degeneration, lumbosacral region: Secondary | ICD-10-CM | POA: Diagnosis not present

## 2015-05-02 DIAGNOSIS — Z0000 Encounter for general adult medical examination without abnormal findings: Secondary | ICD-10-CM

## 2015-05-02 MED ORDER — BUPROPION HCL ER (XL) 300 MG PO TB24
ORAL_TABLET | ORAL | Status: DC
Start: 2015-05-02 — End: 2015-10-14

## 2015-05-02 MED ORDER — HYDROCODONE-ACETAMINOPHEN 10-325 MG PO TABS: 1.0000 | ORAL_TABLET | Freq: Four times a day (QID) | ORAL | Status: DC | PRN

## 2015-05-02 MED ORDER — HYDROXYZINE HCL 25 MG PO TABS: 12.5000 mg | ORAL_TABLET | Freq: Every evening | ORAL | Status: DC | PRN

## 2015-05-02 MED ORDER — HYDROCODONE-ACETAMINOPHEN 10-325 MG PO TABS: ORAL_TABLET | ORAL | Status: DC

## 2015-05-02 MED ORDER — METOPROLOL SUCCINATE ER 25 MG PO TB24: 25.0000 mg | ORAL_TABLET | Freq: Every day | ORAL | Status: DC

## 2015-05-02 MED ORDER — PRAVASTATIN SODIUM 40 MG PO TABS: 40.0000 mg | ORAL_TABLET | Freq: Every day | ORAL | Status: DC

## 2015-05-02 MED ORDER — CYCLOBENZAPRINE HCL 10 MG PO TABS: 10.0000 mg | ORAL_TABLET | Freq: Every evening | ORAL | Status: DC | PRN

## 2015-05-02 NOTE — Patient Instructions (Signed)
I will contact you with your lab results as soon as they are available.   If you have not heard from me in 2 weeks, please contact me.  The fastest way to get your results is to register for My Chart (see the instructions on the last page of this printout).  Keeping You Healthy  Get These Tests  Blood Pressure- Have your blood pressure checked by your healthcare provider at least once a year.  Normal blood pressure is 120/80.  Weight- Have your body mass index (BMI) calculated to screen for obesity.  BMI is a measure of body fat based on height and weight.  You can calculate your own BMI at www.nhlbisupport.com/bmi/  Cholesterol- Have your cholesterol checked every year.  Diabetes- Have your blood sugar checked every year if you have high blood pressure, high cholesterol, a family history of diabetes or if you are overweight.  Pap Test - Have a pap test every 1 to 5 years if you have been sexually active.  If you are older than 65 and recent pap tests have been normal you may not need additional pap tests.  In addition, if you have had a hysterectomy  for benign disease additional pap tests are not necessary.  Mammogram-Yearly mammograms are essential for early detection of breast cancer  Screening for Colon Cancer- Colonoscopy starting at age 50. Screening may begin sooner depending on your family history and other health conditions.  Follow up colonoscopy as directed by your Gastroenterologist.  Screening for Osteoporosis- Screening begins at age 65 with bone density scanning, sooner if you are at higher risk for developing Osteoporosis.  Get these medicines  Calcium with Vitamin D- Your body requires 1200-1500 mg of Calcium a day and 800-1000 IU of Vitamin D a day.  You can only absorb 500 mg of Calcium at a time therefore Calcium must be taken in 2 or 3 separate doses throughout the day.  Hormones- Hormone therapy has been associated with increased risk for certain cancers and heart  disease.  Talk to your healthcare provider about if you need relief from menopausal symptoms.  Aspirin- Ask your healthcare provider about taking Aspirin to prevent Heart Disease and Stroke.  Get these Immuniztions  Flu shot- Every fall  Pneumonia shot- Once after the age of 65; if you are younger ask your healthcare provider if you need a pneumonia shot.  Tetanus- Every ten years.  Zostavax- Once after the age of 60 to prevent shingles.  Take these steps  Don't smoke- Your healthcare provider can help you quit. For tips on how to quit, ask your healthcare provider or go to www.smokefree.gov or call 1-800 QUIT-NOW.  Be physically active- Exercise 5 days a week for a minimum of 30 minutes.  If you are not already physically active, start slow and gradually work up to 30 minutes of moderate physical activity.  Try walking, dancing, bike riding, swimming, etc.  Eat a healthy diet- Eat a variety of healthy foods such as fruits, vegetables, whole grains, low fat milk, low fat cheeses, yogurt, lean meats, chicken, fish, eggs, dried beans, tofu, etc.  For more information go to www.thenutritionsource.org  Dental visit- Brush and floss teeth twice daily; visit your dentist twice a year.  Eye exam- Visit your Optometrist or Ophthalmologist yearly.  Drink alcohol in moderation- Limit alcohol intake to one drink or less a day.  Never drink and drive.  Depression- Your emotional health is as important as your physical health.  If you're   feeling down or losing interest in things you normally enjoy, please talk to your healthcare provider.  Seat Belts- can save your life; always wear one  Smoke/Carbon Monoxide detectors- These detectors need to be installed on the appropriate level of your home.  Replace batteries at least once a year.  Violence- If anyone is threatening or hurting you, please tell your healthcare provider.  Living Will/ Health care power of attorney- Discuss with your  healthcare provider and family. 

## 2015-05-02 NOTE — Progress Notes (Signed)
Patient ID: Jamie Walters, female    DOB: [birth year], 54 y.o.   MRN: [id number]  PCP: Kesi Perrow, [name]  Subjective:   Chief Complaint  Patient presents with  . Medication Refill  . Hypertension  . Hyperlipidemia    HPI Presents for evaluation of her chronic medical problems. HTN and lipids have been well controlled, and she's tolerating her medications without adverse effects.  "I have no sex drive." Not interested in adding another medication for mood. Is happy with Wellbutrin at the current dose, though reports irritability persists.  Review of Systems  Constitutional: Negative.   HENT: Negative.   Eyes: Negative.   Respiratory: Negative.   Cardiovascular: Negative.   Gastrointestinal: Negative.   Genitourinary: Negative.   Musculoskeletal: Positive for back pain (baseline).  Skin: Negative.   Neurological: Negative.   Psychiatric/Behavioral: Positive for dysphoric mood. Negative for suicidal ideas and self-injury. The patient is nervous/anxious.        Patient Active Problem List   Diagnosis Date Noted  . Hyperplastic colon polyp 03/09/2014  . HTN (hypertension) 12/03/2012  . Hyperlipidemia 12/03/2012  . Mild carotid artery disease 12/03/2012  . DDD (degenerative disc disease), lumbosacral 12/03/2012  . Depression 12/03/2012  . HA (headache) 12/03/2012     Prior to Admission medications   Medication Sig Start Date End Date Taking? Authorizing Provider  aspirin 81 MG tablet Take 81 mg by mouth daily.   Yes Historical Provider, MD  buPROPion (WELLBUTRIN XL) 300 MG 24 hr tablet Take 1.5 tablets QD for depression and headache. 03/16/15  Yes Krislyn Donnan, [name]  cyclobenzaprine (FLEXERIL) 10 MG tablet Take 1 tablet (10 mg total) by mouth at bedtime as needed for muscle spasms. 09/21/14  Yes Dian Laprade, [name]  HYDROcodone-acetaminophen (NORCO) 10-325 MG per tablet Take 1 tablet by mouth every 6 (six) hours as needed (pain). Walters fill 60 days after date on  prescription 12/23/14  Yes Jorel Gravlin, [name]  HYDROcodone-acetaminophen (NORCO) 10-325 MG per tablet TAKE ONE TABLET BY MOUTH EVERY 6 HOURS AS NEEDED FOR PAIN. 03/16/15  Yes Lenisha Lacap, [name]  HYDROcodone-acetaminophen (NORCO) 10-325 MG per tablet Take 1 tablet by mouth every 6 (six) hours as needed (pain). Walters fill 30 days after date on prescription 03/16/15  Yes Kimberly Nieland, [name]  hydrOXYzine (ATARAX/VISTARIL) 25 MG tablet Take 0.5-2 tablets (12.5-50 mg total) by mouth at bedtime as needed for anxiety or itching (insomnia). 09/20/14  Yes Sukhmani Fetherolf, [name]  metoprolol succinate (TOPROL-XL) 25 MG 24 hr tablet Take 1 tablet (25 mg total) by mouth daily. 03/16/15  Yes Dellene Mcgroarty, [name]  pravastatin (PRAVACHOL) 40 MG tablet Take 1 tablet (40 mg total) by mouth daily. 09/20/14  Yes Maame Dack, [name]  varenicline (CHANTIX) 1 MG tablet Take 1 tablet (1 mg total) by mouth 2 (two) times daily. Use for smoking cessation. Patient not taking: Reported on 09/20/2014 12/03/12   Porfirio Oar, [name]     No Known Allergies     Objective:  Physical Exam  Constitutional: She is oriented to person, place, and time. Vital signs are normal. She appears well-developed and well-nourished. She is active and cooperative. No distress.  BP 138/72 mmHg  Pulse 73  Temp(Src) 98.1 F (36.7 C) (Oral)  Resp 16  Ht 5\' 2"  (1.575 m)  Wt 134 lb 6.4 oz (60.963 kg)  BMI 24.58 kg/m2  SpO2 98%   HENT:  Head: Normocephalic and atraumatic.  Right Ear: Hearing, tympanic membrane, external ear and ear canal normal. No foreign  bodies.  Left Ear: Hearing, tympanic membrane, external ear and ear canal normal. No foreign bodies.  Nose: Nose normal.  Mouth/Throat: Uvula is midline, oropharynx is clear and moist and mucous membranes are normal. No oral lesions. Normal dentition. No dental abscesses or uvula swelling. No oropharyngeal exudate.  Eyes: Conjunctivae, EOM and lids are normal. Pupils are equal, round, and reactive  to light. Right eye exhibits no discharge. Left eye exhibits no discharge. No scleral icterus.  Fundoscopic exam:      The right eye shows no arteriolar narrowing, no AV nicking, no exudate, no hemorrhage and no papilledema. The right eye shows red reflex.       The left eye shows no arteriolar narrowing, no AV nicking, no exudate, no hemorrhage and no papilledema. The left eye shows red reflex.  Neck: Trachea normal, normal range of motion and full passive range of motion without pain. Neck supple. No spinous process tenderness and no muscular tenderness present. No thyroid mass and no thyromegaly present.  Cardiovascular: Normal rate, regular rhythm, normal heart sounds, intact distal pulses and normal pulses.   Pulmonary/Chest: Effort normal and breath sounds normal.  Musculoskeletal: She exhibits no edema.       Cervical back: Normal.       Thoracic back: Normal.       Lumbar back: She exhibits tenderness, bony tenderness and pain. She exhibits normal range of motion, no swelling, no edema, no deformity, no laceration and no spasm.  Lymphadenopathy:       Head (right side): No tonsillar, no preauricular, no posterior auricular and no occipital adenopathy present.       Head (left side): No tonsillar, no preauricular, no posterior auricular and no occipital adenopathy present.    She has no cervical adenopathy.       Right: No supraclavicular adenopathy present.       Left: No supraclavicular adenopathy present.  Neurological: She is alert and oriented to person, place, and time. She has normal strength and normal reflexes. No cranial nerve deficit. She exhibits normal muscle tone. Coordination and gait normal.  Skin: Skin is warm, dry and intact. No rash noted. She is not diaphoretic. No cyanosis or erythema. Nails show no clubbing.  Psychiatric: She has a normal mood and affect. Her speech is normal and behavior is normal. Judgment and thought content normal.           Assessment &  Plan:   1. DDD (degenerative disc disease), lumbosacral Stable. No worsening. COntinue current treatment. - HYDROcodone-acetaminophen (NORCO) 10-325 MG per tablet; Take 1 tablet by mouth every 6 (six) hours as needed (pain). Walters fill 60 days after date on prescription  Dispense: 120 tablet; Refill: 0 - HYDROcodone-acetaminophen (NORCO) 10-325 MG per tablet; Take 1 tablet by mouth every 6 (six) hours as needed (pain). Walters fill 30 days after date on prescription  Dispense: 120 tablet; Refill: 0 - cyclobenzaprine (FLEXERIL) 10 MG tablet; Take 1 tablet (10 mg total) by mouth at bedtime as needed for muscle spasms.  Dispense: 30 tablet; Refill: 0 - HYDROcodone-acetaminophen (NORCO) 10-325 MG per tablet; TAKE ONE TABLET BY MOUTH EVERY 6 HOURS AS NEEDED FOR PAIN.  Dispense: 120 tablet; Refill: 0  2. Itching Stable. Controlled with current treatment. - hydrOXYzine (ATARAX/VISTARIL) 25 MG tablet; Take 0.5-2 tablets (12.5-50 mg total) by mouth at bedtime as needed for anxiety or itching (insomnia).  Dispense: 60 tablet; Refill: 1  3. Insomnia Stable. Controlled with current treatment. - hydrOXYzine (ATARAX/VISTARIL) 25 MG  tablet; Take 0.5-2 tablets (12.5-50 mg total) by mouth at bedtime as needed for anxiety or itching (insomnia).  Dispense: 60 tablet; Refill: 1  4. Essential hypertension Controlled. No adjustments made. Continue current treatment. - metoprolol succinate (TOPROL-XL) 25 MG 24 hr tablet; Take 1 tablet (25 mg total) by mouth daily.  Dispense: 30 tablet; Refill: 1 - CBC with Differential/Platelet - Comprehensive metabolic panel  5. Hyperlipidemia Await lab results. Continue current treatment. - pravastatin (PRAVACHOL) 40 MG tablet; Take 1 tablet (40 mg total) by mouth daily.  Dispense: 30 tablet; Refill: 5 - Lipid panel  6. Depression Not interested in changing her therapy at all, even though I suspect that her lack of libido is at least in part due to inadequately controlled  depression. - buPROPion (WELLBUTRIN XL) 300 MG 24 hr tablet; Take 1.5 tablets QD for depression and headache.  Dispense: 45 tablet; Refill: 1  7. Screening for HIV (human immunodeficiency virus) - HIV antibody  8. Screening for osteoporosis - DG Bone Density; Future  9. Screening for breast cancer - MM Digital Screening; Future  10. Encounter for preventive health examination Not due for pap testing. Vaccinations are current. - DG Bone Density; Future - MM Digital Screening; Future  Return in about 6 months (around 11/02/2015) for follow-up.   Fernande Bras, [name] Physician Assistant-Certified [facility]

## 2015-05-03 LAB — COMPREHENSIVE METABOLIC PANEL
ALBUMIN: 4.4 g/dL (ref 3.6–5.1)
ALT: 23 U/L (ref 6–29)
AST: 24 U/L (ref 10–35)
Alkaline Phosphatase: 81 U/L (ref 33–130)
BILIRUBIN TOTAL: 0.3 mg/dL (ref 0.2–1.2)
BUN: 14 mg/dL (ref 7–25)
CALCIUM: 9.7 mg/dL (ref 8.6–10.4)
CHLORIDE: 105 mmol/L (ref 98–110)
CO2: 26 mmol/L (ref 20–31)
Creat: 0.64 mg/dL (ref 0.50–1.05)
Glucose, Bld: 81 mg/dL (ref 65–99)
Potassium: 4.3 mmol/L (ref 3.5–5.3)
Sodium: 138 mmol/L (ref 135–146)
TOTAL PROTEIN: 6.5 g/dL (ref 6.1–8.1)

## 2015-05-03 LAB — CBC WITH DIFFERENTIAL/PLATELET
Basophils Absolute: 0 10*3/uL (ref 0.0–0.1)
Basophils Relative: 0 % (ref 0–1)
Eosinophils Absolute: 0.1 10*3/uL (ref 0.0–0.7)
Eosinophils Relative: 1 % (ref 0–5)
HEMATOCRIT: 39.9 % (ref 36.0–46.0)
HEMOGLOBIN: 13.2 g/dL (ref 12.0–15.0)
LYMPHS ABS: 3 10*3/uL (ref 0.7–4.0)
LYMPHS PCT: 45 % (ref 12–46)
MCH: 30.8 pg (ref 26.0–34.0)
MCHC: 33.1 g/dL (ref 30.0–36.0)
MCV: 93.2 fL (ref 78.0–100.0)
MONO ABS: 0.7 10*3/uL (ref 0.1–1.0)
MONOS PCT: 11 % (ref 3–12)
MPV: 10.3 fL (ref 8.6–12.4)
NEUTROS ABS: 2.8 10*3/uL (ref 1.7–7.7)
Neutrophils Relative %: 43 % (ref 43–77)
Platelets: 261 10*3/uL (ref 150–400)
RBC: 4.28 MIL/uL (ref 3.87–5.11)
RDW: 13.9 % (ref 11.5–15.5)
WBC: 6.6 10*3/uL (ref 4.0–10.5)

## 2015-05-03 LAB — LIPID PANEL
CHOLESTEROL: 245 mg/dL — AB (ref 125–200)
HDL: 54 mg/dL (ref >=46)
LDL CALC: 163 mg/dL — AB (ref <130)
TRIGLYCERIDES: 142 mg/dL (ref <150)
Total CHOL/HDL Ratio: 4.5 Ratio (ref <=5.0)
VLDL: 28 mg/dL (ref <30)

## 2015-05-03 LAB — HIV ANTIBODY (ROUTINE TESTING W REFLEX): HIV 1&2 Ab, 4th Generation: NONREACTIVE

## 2015-05-04 ENCOUNTER — Encounter: Payer: Self-pay | Admitting: Physician Assistant

## 2015-08-11 ENCOUNTER — Other Ambulatory Visit: Payer: Self-pay | Admitting: Physician Assistant

## 2015-08-11 NOTE — Telephone Encounter (Signed)
HYDROcodone-acetaminophen (NORCO) 10-325 MG per tablet   [phone] (M)   chelle

## 2015-08-15 ENCOUNTER — Telehealth: Payer: Self-pay | Admitting: Physician Assistant

## 2015-08-15 DIAGNOSIS — M5137 Other intervertebral disc degeneration, lumbosacral region: Secondary | ICD-10-CM

## 2015-08-15 MED ORDER — HYDROCODONE-ACETAMINOPHEN 10-325 MG PO TABS: 1.0000 | ORAL_TABLET | Freq: Four times a day (QID) | ORAL | Status: DC | PRN

## 2015-08-15 MED ORDER — HYDROCODONE-ACETAMINOPHEN 10-325 MG PO TABS: ORAL_TABLET | ORAL | Status: DC

## 2015-08-15 NOTE — Telephone Encounter (Signed)
I see the request on 08/11/2015, but it was never sent to me. Her request for Toprol XL was authorized on my behalf.  Rx printed at 104. Will bring to 102 after clinic.  Meds ordered this encounter  Medications  . HYDROcodone-acetaminophen (NORCO) 10-325 MG tablet    Sig: Take 1 tablet by mouth every 6 (six) hours as needed (pain). May fill 60 days after date on prescription    Dispense:  120 tablet    Refill:  0    Order Specific Question:  Supervising Provider    Answer:  DOOLITTLE, ROBERT P [[id number]]  . HYDROcodone-acetaminophen (NORCO) 10-325 MG tablet    Sig: Take 1 tablet by mouth every 6 (six) hours as needed (pain). May fill 30 days after date on prescription    Dispense:  120 tablet    Refill:  0    Order Specific Question:  Supervising Provider    Answer:  DOOLITTLE, ROBERT P [[id number]]  . HYDROcodone-acetaminophen (NORCO) 10-325 MG tablet    Sig: TAKE ONE TABLET BY MOUTH EVERY 6 HOURS AS NEEDED FOR PAIN.    Dispense:  120 tablet    Refill:  0    Order Specific Question:  Supervising Provider    Answer:  DOOLITTLE, ROBERT P [[id number]]

## 2015-08-15 NOTE — Telephone Encounter (Signed)
Patient requested a refill on Hydrocodone on 08/11/2015. She called to check the status of it.   [phone]

## 2015-08-16 NOTE — Telephone Encounter (Signed)
Rx's ready to be picked.

## 2015-10-12 ENCOUNTER — Other Ambulatory Visit: Payer: Self-pay | Admitting: Physician Assistant

## 2015-10-16 ENCOUNTER — Other Ambulatory Visit: Payer: Self-pay

## 2015-10-16 MED ORDER — BUPROPION HCL ER (XL) 150 MG PO TB24: 450.0000 mg | ORAL_TABLET | Freq: Every day | ORAL | Status: DC

## 2015-11-07 ENCOUNTER — Other Ambulatory Visit: Payer: Self-pay

## 2015-11-07 ENCOUNTER — Other Ambulatory Visit: Payer: Self-pay | Admitting: Physician Assistant

## 2015-11-07 ENCOUNTER — Ambulatory Visit: Payer: Self-pay | Admitting: Physician Assistant

## 2015-11-07 DIAGNOSIS — M5137 Other intervertebral disc degeneration, lumbosacral region: Secondary | ICD-10-CM

## 2015-11-07 NOTE — Telephone Encounter (Signed)
Pt is needing a refill on her hydrocodone  Best number [phone]

## 2015-11-08 NOTE — Telephone Encounter (Signed)
Pt is due for f/up, but has appt sch 4/11. I have pended two mos Rxs for review.

## 2015-11-10 MED ORDER — HYDROCODONE-ACETAMINOPHEN 10-325 MG PO TABS: ORAL_TABLET | ORAL | Status: DC

## 2015-11-10 MED ORDER — HYDROCODONE-ACETAMINOPHEN 10-325 MG PO TABS: 1.0000 | ORAL_TABLET | Freq: Four times a day (QID) | ORAL | Status: DC | PRN

## 2015-11-10 NOTE — Telephone Encounter (Signed)
Rx in drawer, pt aware. 

## 2015-11-10 NOTE — Telephone Encounter (Signed)
Meds ordered this encounter  Medications  . HYDROcodone-acetaminophen (NORCO) 10-325 MG tablet    Sig: TAKE ONE TABLET BY MOUTH EVERY 6 HOURS AS NEEDED FOR PAIN.    Dispense:  120 tablet    Refill:  0  . HYDROcodone-acetaminophen (NORCO) 10-325 MG tablet    Sig: Take 1 tablet by mouth every 6 (six) hours as needed (pain). May fill 30 days after date on prescription    Dispense:  120 tablet    Refill:  0

## 2015-12-06 ENCOUNTER — Telehealth: Payer: Self-pay

## 2015-12-06 DIAGNOSIS — M5137 Other intervertebral disc degeneration, lumbosacral region: Secondary | ICD-10-CM

## 2015-12-06 MED ORDER — CYCLOBENZAPRINE HCL 10 MG PO TABS: 10.0000 mg | ORAL_TABLET | Freq: Every evening | ORAL | Status: DC | PRN

## 2015-12-06 NOTE — Telephone Encounter (Signed)
Refilled. Follow up with [name].

## 2015-12-06 NOTE — Telephone Encounter (Signed)
PATIENT WOULD LIKE TO ASK [name] IF SHE CAN GET A REFILL ON HER FLEXORIL FOR HER NECK AND BACK OF HER HEAD PAIN. SHE SAID SHE HAS AN APPOINTMENT TO SEE HER ON December 26, 2015.  BEST PHONE [phone] (CELL)  PHARMACY CHOICE IS [facility] IN [place], [place]  [facility]

## 2015-12-06 NOTE — Telephone Encounter (Signed)
Spoke with pt, advised message. 

## 2015-12-11 ENCOUNTER — Other Ambulatory Visit: Payer: Self-pay | Admitting: Physician Assistant

## 2015-12-18 LAB — HM MAMMOGRAPHY

## 2015-12-26 ENCOUNTER — Encounter: Payer: Self-pay | Admitting: Physician Assistant

## 2015-12-26 ENCOUNTER — Ambulatory Visit (INDEPENDENT_AMBULATORY_CARE_PROVIDER_SITE_OTHER): Payer: PRIVATE HEALTH INSURANCE | Admitting: Physician Assistant

## 2015-12-26 VITALS — BP 124/76 | HR 78 | Temp 98.5°F | Resp 16 | Ht 61.5 in | Wt 137.4 lb

## 2015-12-26 DIAGNOSIS — M5137 Other intervertebral disc degeneration, lumbosacral region: Secondary | ICD-10-CM | POA: Diagnosis not present

## 2015-12-26 DIAGNOSIS — I10 Essential (primary) hypertension: Secondary | ICD-10-CM

## 2015-12-26 DIAGNOSIS — L299 Pruritus, unspecified: Secondary | ICD-10-CM

## 2015-12-26 DIAGNOSIS — L989 Disorder of the skin and subcutaneous tissue, unspecified: Secondary | ICD-10-CM

## 2015-12-26 DIAGNOSIS — E785 Hyperlipidemia, unspecified: Secondary | ICD-10-CM

## 2015-12-26 DIAGNOSIS — G4700 Insomnia, unspecified: Secondary | ICD-10-CM | POA: Diagnosis not present

## 2015-12-26 DIAGNOSIS — F32A Depression, unspecified: Secondary | ICD-10-CM

## 2015-12-26 DIAGNOSIS — F329 Major depressive disorder, single episode, unspecified: Secondary | ICD-10-CM

## 2015-12-26 MED ORDER — BUPROPION HCL ER (XL) 150 MG PO TB24: ORAL_TABLET | ORAL | Status: DC

## 2015-12-26 MED ORDER — HYDROCODONE-ACETAMINOPHEN 10-325 MG PO TABS: ORAL_TABLET | ORAL | Status: DC

## 2015-12-26 MED ORDER — HYDROXYZINE HCL 25 MG PO TABS: 12.5000 mg | ORAL_TABLET | Freq: Every evening | ORAL | Status: DC | PRN

## 2015-12-26 MED ORDER — PRAVASTATIN SODIUM 40 MG PO TABS: 40.0000 mg | ORAL_TABLET | Freq: Every day | ORAL | Status: DC

## 2015-12-26 MED ORDER — METOPROLOL SUCCINATE ER 25 MG PO TB24: ORAL_TABLET | ORAL | Status: DC

## 2015-12-26 MED ORDER — HYDROCODONE-ACETAMINOPHEN 10-325 MG PO TABS: 1.0000 | ORAL_TABLET | Freq: Four times a day (QID) | ORAL | Status: DC | PRN

## 2015-12-26 NOTE — Progress Notes (Signed)
Subjective:     Patient ID: Jamie Walters, female   DOB: December 29, 1960, 55 y.o.   MRN: [id number]  Chief Complaint  Patient presents with  . Medication Refill    HPI  Patient presents for medication refill and callus on right foot.  Patient states she needs all of her medication refilled. She is doing well since her last visit. She is tolerating her medication without any side effects.  She is not fasting today for labs. Last lipid panel was in 04/2015. Her LDL at that time was 163. She states that she has lab work drawn twice a year at work and her LDL in December was 119. She will fax [place] the results from December.  She does complain of a "callus" on her right pinky toe that is painful. She has to wear certain shoes to help with the pain. She states it has been there for 1 year. Pressure makes the pain worse. She has tried OTC wart removal without relief.   Review of Systems  Constitutional: Negative for fever and chills.  HENT: Negative.   Eyes: Negative.   Respiratory: Negative for shortness of breath.   Cardiovascular: Negative for chest pain.  Gastrointestinal: Negative for nausea, vomiting, abdominal pain and diarrhea.  Genitourinary: Negative for dysuria, urgency, frequency, hematuria and flank pain.  Musculoskeletal: Negative for myalgias and arthralgias.  Skin:       Callus on right pinky toe  Neurological: Negative for dizziness, light-headedness and headaches.       Objective:   Physical Exam  Constitutional: She is oriented to person, place, and time. She appears well-developed and well-nourished. No distress.  Eyes: Pupils are equal, round, and reactive to light.  Neck: Normal range of motion. Neck supple. No thyromegaly present.  Cardiovascular: Normal rate, regular rhythm, normal heart sounds and intact distal pulses.   Pulmonary/Chest: Effort normal and breath sounds normal.  Lymphadenopathy:    She has no cervical adenopathy.  Neurological: She is alert and  oriented to person, place, and time.  Skin: Skin is warm and dry.  Hyperkeratotic lesion located on the medial side of the 5th phalange of the right foot that is tender to palpation, no discoloration noted  Psychiatric: She has a normal mood and affect. Her behavior is normal. Judgment and thought content normal.       Assessment/Plan:     1. DDD (degenerative disc disease), lumbosacral - HYDROcodone-acetaminophen (NORCO) 10-325 MG tablet; Take 1 tablet by mouth every 6 (six) hours as needed (pain). Walters fill 30 days after date on prescription  Dispense: 120 tablet; Refill: 0 - HYDROcodone-acetaminophen (NORCO) 10-325 MG tablet; TAKE ONE TABLET BY MOUTH EVERY 6 HOURS AS NEEDED FOR PAIN.  Dispense: 120 tablet; Refill: 0 - HYDROcodone-acetaminophen (NORCO) 10-325 MG tablet; Take 1 tablet by mouth every 6 (six) hours as needed (pain). Walters fill 60 days after date on prescription  Dispense: 120 tablet; Refill: 0  2. Hyperlipidemia Patient to send recent lipid panel from work. - pravastatin (PRAVACHOL) 40 MG tablet; Take 1 tablet (40 mg total) by mouth daily.  Dispense: 90 tablet; Refill: 3  3. Essential hypertension Continue current medication regimen. - metoprolol succinate (TOPROL-XL) 25 MG 24 hr tablet; TAKE ONE TABLET BY MOUTH ONCE DAILY  Dispense: 30 tablet; Refill: 0  4. Depression Stable on current medication regimen. - buPROPion (WELLBUTRIN XL) 150 MG 24 hr tablet; TAKE THREE TABLETS BY MOUTH ONCE DAILY  Dispense: 90 tablet; Refill: 3  5. Itching 6. Insomnia -  hydrOXYzine (ATARAX/VISTARIL) 25 MG tablet; Take 0.5-2 tablets (12.5-50 mg total) by mouth at bedtime as needed for anxiety or itching (insomnia).  Dispense: 60 tablet; Refill: 3   7. Hard skin lesion - Ambulatory referral to Podiatry  Azucena Kubayler Flannery Cavallero PA-S 12/26/2015

## 2015-12-26 NOTE — Patient Instructions (Addendum)
Please bring/send me a copy of the labs done at work.   IF you received an x-ray today, you will receive an invoice from [facility]. Please contact [facility] at [phone] with questions or concerns regarding your invoice.   IF you received labwork today, you will receive an invoice from [facility]. Please contact [facility] at [phone] with questions or concerns regarding your invoice.   Our billing staff will not be able to assist you with questions regarding bills from these companies.  You will be contacted with the lab results as soon as they are available. The fastest way to get your results is to activate your My Chart account. Instructions are located on the last page of this paperwork. If you have not heard from us regarding the results in 2 weeks, please contact this office.

## 2015-12-28 NOTE — Progress Notes (Signed)
Patient ID: Jamie Walters, female    DOB: 06-14-1961, 55 y.o.   MRN: [id number]  PCP: Ravi Tuccillo, [name]  Subjective:   Chief Complaint  Patient presents with  . Medication Refill    HPI Patient presents for medication refill and callus on right foot.   Patient states she needs all of her medications refilled.   She is doing well since her last visit. She is tolerating her medication without any side effects.   She is not fasting today for labs. Last lipid panel was in 04/2015. Her LDL at that time was 163. She states that she has lab work drawn twice a year at work and her LDL in December was 119. She will fax [place] the results from December.   She does complain of a "callus" on her right pinky toe that is painful. She has to wear certain shoes to help with the pain. She states it has been there for 1 year. Pressure makes the pain worse. She has tried OTC wart removal without relief.    Review of Systems Constitutional: Negative for fever and chills.  HENT: Negative.  Eyes: Negative.  Respiratory: Negative for shortness of breath.  Cardiovascular: Negative for chest pain.  Gastrointestinal: Negative for nausea, vomiting, abdominal pain and diarrhea.  Genitourinary: Negative for dysuria, urgency, frequency, hematuria and flank pain.  Musculoskeletal: Negative for myalgias and arthralgias.  Skin:   Callus on right pinky toe  Neurological: Negative for dizziness, light-headedness and headaches.     Patient Active Problem List   Diagnosis Date Noted  . Hyperplastic colon polyp 03/09/2014  . HTN (hypertension) 12/03/2012  . Hyperlipidemia 12/03/2012  . Mild carotid artery disease (HCC) 12/03/2012  . DDD (degenerative disc disease), lumbosacral 12/03/2012  . Depression 12/03/2012  . HA (headache) 12/03/2012     Prior to Admission medications   Medication Sig Start Date End Date Taking? Authorizing Provider  aspirin 81 MG tablet Take 81 mg by mouth daily.    Yes Historical Provider, MD  buPROPion (WELLBUTRIN XL) 150 MG 24 hr tablet TAKE THREE TABLETS BY MOUTH ONCE DAILY 12/26/15  Yes Edrie Ehrich, [name]  cyclobenzaprine (FLEXERIL) 10 MG tablet Take 1 tablet (10 mg total) by mouth at bedtime as needed for muscle spasms. 12/06/15  Yes Dorna Leitz, [name]  HYDROcodone-acetaminophen (NORCO) 10-325 MG tablet Take 1 tablet by mouth every 6 (six) hours as needed (pain). Walters fill 30 days after date on prescription 12/26/15  Yes Deisy Ozbun, [name]  HYDROcodone-acetaminophen (NORCO) 10-325 MG tablet TAKE ONE TABLET BY MOUTH EVERY 6 HOURS AS NEEDED FOR PAIN. 12/26/15  Yes Karley Pho, [name]  HYDROcodone-acetaminophen (NORCO) 10-325 MG tablet Take 1 tablet by mouth every 6 (six) hours as needed (pain). Walters fill 60 days after date on prescription 12/26/15  Yes Roen Macgowan, [name]  hydrOXYzine (ATARAX/VISTARIL) 25 MG tablet Take 0.5-2 tablets (12.5-50 mg total) by mouth at bedtime as needed for anxiety or itching (insomnia). 12/26/15  Yes Efe Fazzino, [name]  metoprolol succinate (TOPROL-XL) 25 MG 24 hr tablet TAKE ONE TABLET BY MOUTH ONCE DAILY 12/26/15  Yes Asli Tokarski, [name]  pravastatin (PRAVACHOL) 40 MG tablet Take 1 tablet (40 mg total) by mouth daily. 12/26/15  Yes Ahlayah Tarkowski, [name]  varenicline (CHANTIX) 1 MG tablet Take 1 tablet (1 mg total) by mouth 2 (two) times daily. Use for smoking cessation. Patient not taking: Reported on 09/20/2014 12/03/12   Porfirio Oar, [name]     No Known Allergies  Objective:  Physical Exam  Constitutional: She is oriented to person, place, and time. She appears well-developed and well-nourished. She is active and cooperative. No distress.  BP 124/76 mmHg  Pulse 78  Temp(Src) 98.5 F (36.9 C) (Oral)  Resp 16  Ht 5' 1.5" (1.562 m)  Wt 137 lb 6.4 oz (62.324 kg)  BMI 25.54 kg/m2  SpO2 98%  HENT:  Head: Normocephalic and atraumatic.  Right Ear: Hearing normal.  Left Ear: Hearing normal.  Eyes: Conjunctivae are  normal. No scleral icterus.  Neck: Normal range of motion. Neck supple. No thyromegaly present.  Cardiovascular: Normal rate, regular rhythm and normal heart sounds.   Pulses:      Radial pulses are 2+ on the right side, and 2+ on the left side.  Pulmonary/Chest: Effort normal and breath sounds normal.  Musculoskeletal: She exhibits tenderness (lumbar spine and paraspinous muscles is baseline).  Lymphadenopathy:       Head (right side): No tonsillar, no preauricular, no posterior auricular and no occipital adenopathy present.       Head (left side): No tonsillar, no preauricular, no posterior auricular and no occipital adenopathy present.    She has no cervical adenopathy.       Right: No supraclavicular adenopathy present.       Left: No supraclavicular adenopathy present.  Neurological: She is alert and oriented to person, place, and time. No sensory deficit.  Skin: Skin is warm, dry and intact. Lesion (hyperkeratosis of the skin of the RIGHT 5th toe. ?Verrucal lesion?) noted. No rash noted. No cyanosis or erythema. Nails show no clubbing.  Psychiatric: She has a normal mood and affect. Her speech is normal and behavior is normal.           Assessment & Plan:   1. DDD (degenerative disc disease), lumbosacral Stable. No escalation of use. Continue current treatment. - HYDROcodone-acetaminophen (NORCO) 10-325 MG tablet; Take 1 tablet by mouth every 6 (six) hours as needed (pain). Walters fill 30 days after date on prescription  Dispense: 120 tablet; Refill: 0 - HYDROcodone-acetaminophen (NORCO) 10-325 MG tablet; TAKE ONE TABLET BY MOUTH EVERY 6 HOURS AS NEEDED FOR PAIN.  Dispense: 120 tablet; Refill: 0 - HYDROcodone-acetaminophen (NORCO) 10-325 MG tablet; Take 1 tablet by mouth every 6 (six) hours as needed (pain). Walters fill 60 days after date on prescription  Dispense: 120 tablet; Refill: 0  2. Hyperlipidemia LDL apparently improved based on report of results from work labs. Ask that she  send me a copy. - pravastatin (PRAVACHOL) 40 MG tablet; Take 1 tablet (40 mg total) by mouth daily.  Dispense: 90 tablet; Refill: 3  3. Essential hypertension Controlled. Continue current treatment. - metoprolol succinate (TOPROL-XL) 25 MG 24 hr tablet; TAKE ONE TABLET BY MOUTH ONCE DAILY  Dispense: 30 tablet; Refill: 0  4. Depression Stable. Continue current treatment. - buPROPion (WELLBUTRIN XL) 150 MG 24 hr tablet; TAKE THREE TABLETS BY MOUTH ONCE DAILY  Dispense: 90 tablet; Refill: 3  5. Itching Stable. Controlled. Continue current treatment. - hydrOXYzine (ATARAX/VISTARIL) 25 MG tablet; Take 0.5-2 tablets (12.5-50 mg total) by mouth at bedtime as needed for anxiety or itching (insomnia).  Dispense: 60 tablet; Refill: 3  6. Insomnia Stable. Controlled. Continue current treatment. - hydrOXYzine (ATARAX/VISTARIL) 25 MG tablet; Take 0.5-2 tablets (12.5-50 mg total) by mouth at bedtime as needed for anxiety or itching (insomnia).  Dispense: 60 tablet; Refill: 3  7. Hard skin lesion Possibly just callous. Suspect verrucal lesion. Podiatry to evaluate/treat. - Ambulatory  referral to Podiatry   Fernande Bras, [name] Physician Assistant-Certified [facility]

## 2016-01-03 ENCOUNTER — Encounter: Payer: Self-pay | Admitting: Family Medicine

## 2016-01-18 ENCOUNTER — Telehealth: Payer: Self-pay

## 2016-01-18 DIAGNOSIS — M5137 Other intervertebral disc degeneration, lumbosacral region: Secondary | ICD-10-CM

## 2016-01-18 MED ORDER — CYCLOBENZAPRINE HCL 10 MG PO TABS: 10.0000 mg | ORAL_TABLET | Freq: Every evening | ORAL | Status: DC | PRN

## 2016-01-18 NOTE — Telephone Encounter (Signed)
Chell,  Requesting muscle relaxer for her neck.    [facility] in [place]   [phone] (M)

## 2016-01-18 NOTE — Telephone Encounter (Signed)
Meds ordered this encounter  Medications  . cyclobenzaprine (FLEXERIL) 10 MG tablet    Sig: Take 1 tablet (10 mg total) by mouth at bedtime as needed for muscle spasms.    Dispense:  30 tablet    Refill:  0    Order Specific Question:  Supervising Provider    Answer:  DOOLITTLE, ROBERT P [[id number]]

## 2016-01-19 NOTE — Telephone Encounter (Signed)
Notified pt on VM that Rx was sent to pharmacy for flexeril.

## 2016-02-13 ENCOUNTER — Other Ambulatory Visit: Payer: Self-pay | Admitting: Physician Assistant

## 2016-04-08 ENCOUNTER — Telehealth: Payer: Self-pay

## 2016-04-08 DIAGNOSIS — M5137 Other intervertebral disc degeneration, lumbosacral region: Secondary | ICD-10-CM

## 2016-04-08 NOTE — Telephone Encounter (Signed)
Patient is calling to request a refill for hydrocodone. [phone]

## 2016-04-09 MED ORDER — HYDROCODONE-ACETAMINOPHEN 10-325 MG PO TABS: ORAL_TABLET | ORAL | 0 refills | Status: DC

## 2016-04-09 MED ORDER — HYDROCODONE-ACETAMINOPHEN 10-325 MG PO TABS: 1.0000 | ORAL_TABLET | Freq: Four times a day (QID) | ORAL | 0 refills | Status: DC | PRN

## 2016-04-09 NOTE — Telephone Encounter (Signed)
LMOM that Rxs are ready. 

## 2016-04-09 NOTE — Telephone Encounter (Signed)
Meds ordered this encounter  Medications  . HYDROcodone-acetaminophen (NORCO) 10-325 MG tablet    Sig: Take 1 tablet by mouth every 6 (six) hours as needed (pain). May fill 60 days after date on prescription    Dispense:  120 tablet    Refill:  0    Order Specific Question:   Supervising Provider    Answer:   Clelia Croft, EVA N [[id number]]  . HYDROcodone-acetaminophen (NORCO) 10-325 MG tablet    Sig: Take 1 tablet by mouth every 6 (six) hours as needed (pain). May fill 30 days after date on prescription    Dispense:  120 tablet    Refill:  0    Order Specific Question:   Supervising Provider    Answer:   Clelia Croft, EVA N [[id number]]  . HYDROcodone-acetaminophen (NORCO) 10-325 MG tablet    Sig: TAKE ONE TABLET BY MOUTH EVERY 6 HOURS AS NEEDED FOR PAIN.    Dispense:  120 tablet    Refill:  0    Order Specific Question:   Supervising Provider    Answer:   Clelia Croft, EVA N [[id number]]

## 2016-05-08 ENCOUNTER — Other Ambulatory Visit: Payer: Self-pay

## 2016-05-08 DIAGNOSIS — M5137 Other intervertebral disc degeneration, lumbosacral region: Secondary | ICD-10-CM

## 2016-05-08 NOTE — Telephone Encounter (Signed)
Pt is having neck issues and she would like a muscle relaxer. She said this has been done in the past before. She would like us to use Pharmacy:  [facility] [address] - [place], [place] - [address] Please advise at [phone]

## 2016-05-09 NOTE — Telephone Encounter (Signed)
Jamie Walters, pt last seen for DDD in April. She has been Rxd flexeril several times in the past, but only given 30 days w/no RFs. Do you want to send RFs?

## 2016-05-10 MED ORDER — CYCLOBENZAPRINE HCL 10 MG PO TABS: 10.0000 mg | ORAL_TABLET | Freq: Every evening | ORAL | 0 refills | Status: DC | PRN

## 2016-05-10 NOTE — Telephone Encounter (Signed)
Meds ordered this encounter  Medications  . cyclobenzaprine (FLEXERIL) 10 MG tablet    Sig: Take 1 tablet (10 mg total) by mouth at bedtime as needed for muscle spasms.    Dispense:  30 tablet    Refill:  0    

## 2016-05-13 ENCOUNTER — Other Ambulatory Visit: Payer: Self-pay | Admitting: Physician Assistant

## 2016-05-13 DIAGNOSIS — F32A Depression, unspecified: Secondary | ICD-10-CM

## 2016-05-13 DIAGNOSIS — F329 Major depressive disorder, single episode, unspecified: Secondary | ICD-10-CM

## 2016-05-15 ENCOUNTER — Other Ambulatory Visit: Payer: Self-pay

## 2016-05-15 DIAGNOSIS — M5137 Other intervertebral disc degeneration, lumbosacral region: Secondary | ICD-10-CM

## 2016-05-15 NOTE — Telephone Encounter (Signed)
Pharm req'd RFs of hydrocodone, but pt was given 3 RFs on 7/25 and should have enough until after her appt in Oct. Denied RF w/note.

## 2016-06-11 ENCOUNTER — Other Ambulatory Visit: Payer: Self-pay | Admitting: Physician Assistant

## 2016-06-11 DIAGNOSIS — F32A Depression, unspecified: Secondary | ICD-10-CM

## 2016-06-11 DIAGNOSIS — F329 Major depressive disorder, single episode, unspecified: Secondary | ICD-10-CM

## 2016-06-17 ENCOUNTER — Telehealth: Payer: Self-pay

## 2016-06-17 MED ORDER — BUPROPION HCL ER (XL) 150 MG PO TB24: ORAL_TABLET | ORAL | 0 refills | Status: DC

## 2016-06-17 NOTE — Telephone Encounter (Signed)
Sent in 1 mos RF and notified pt on VM. 

## 2016-06-17 NOTE — Telephone Encounter (Signed)
Patient have an appointment with [name] on October 17th at 3:15. Patient stated her medication refill was denied because she need a office visit.  Patient is scheduled and patient is requesting for buPROPion (WELLBUTRIN XL) 150 MG 24 hr tablet Until her appointment time.    [phone]

## 2016-07-02 ENCOUNTER — Ambulatory Visit (INDEPENDENT_AMBULATORY_CARE_PROVIDER_SITE_OTHER): Payer: PRIVATE HEALTH INSURANCE | Admitting: Physician Assistant

## 2016-07-02 ENCOUNTER — Encounter: Payer: Self-pay | Admitting: Physician Assistant

## 2016-07-02 VITALS — BP 126/82 | HR 84 | Temp 98.4°F | Resp 18 | Ht 61.5 in | Wt 138.0 lb

## 2016-07-02 DIAGNOSIS — F17200 Nicotine dependence, unspecified, uncomplicated: Secondary | ICD-10-CM | POA: Diagnosis not present

## 2016-07-02 DIAGNOSIS — L299 Pruritus, unspecified: Secondary | ICD-10-CM

## 2016-07-02 DIAGNOSIS — M51379 Other intervertebral disc degeneration, lumbosacral region without mention of lumbar back pain or lower extremity pain: Secondary | ICD-10-CM

## 2016-07-02 DIAGNOSIS — Z720 Tobacco use: Secondary | ICD-10-CM | POA: Diagnosis not present

## 2016-07-02 DIAGNOSIS — R51 Headache: Secondary | ICD-10-CM | POA: Diagnosis not present

## 2016-07-02 DIAGNOSIS — Z79891 Long term (current) use of opiate analgesic: Secondary | ICD-10-CM

## 2016-07-02 DIAGNOSIS — I10 Essential (primary) hypertension: Secondary | ICD-10-CM | POA: Diagnosis not present

## 2016-07-02 DIAGNOSIS — R519 Headache, unspecified: Secondary | ICD-10-CM

## 2016-07-02 DIAGNOSIS — G4700 Insomnia, unspecified: Secondary | ICD-10-CM | POA: Diagnosis not present

## 2016-07-02 DIAGNOSIS — M5137 Other intervertebral disc degeneration, lumbosacral region: Secondary | ICD-10-CM | POA: Diagnosis not present

## 2016-07-02 DIAGNOSIS — F329 Major depressive disorder, single episode, unspecified: Secondary | ICD-10-CM

## 2016-07-02 DIAGNOSIS — F32A Depression, unspecified: Secondary | ICD-10-CM

## 2016-07-02 DIAGNOSIS — E785 Hyperlipidemia, unspecified: Secondary | ICD-10-CM

## 2016-07-02 MED ORDER — HYDROCODONE-ACETAMINOPHEN 10-325 MG PO TABS: 1.0000 | ORAL_TABLET | Freq: Four times a day (QID) | ORAL | 0 refills | Status: DC | PRN

## 2016-07-02 MED ORDER — HYDROCODONE-ACETAMINOPHEN 10-325 MG PO TABS: ORAL_TABLET | ORAL | 0 refills | Status: DC

## 2016-07-02 MED ORDER — VARENICLINE TARTRATE 1 MG PO TABS: 1.0000 mg | ORAL_TABLET | Freq: Two times a day (BID) | ORAL | 5 refills | Status: DC

## 2016-07-02 MED ORDER — CYCLOBENZAPRINE HCL 10 MG PO TABS: 10.0000 mg | ORAL_TABLET | Freq: Every evening | ORAL | 0 refills | Status: DC | PRN

## 2016-07-02 MED ORDER — METOPROLOL SUCCINATE ER 25 MG PO TB24: 25.0000 mg | ORAL_TABLET | Freq: Every day | ORAL | 12 refills | Status: DC

## 2016-07-02 MED ORDER — BUPROPION HCL ER (XL) 150 MG PO TB24: ORAL_TABLET | ORAL | 3 refills | Status: DC

## 2016-07-02 MED ORDER — HYDROXYZINE HCL 25 MG PO TABS: 12.5000 mg | ORAL_TABLET | Freq: Every evening | ORAL | 3 refills | Status: DC | PRN

## 2016-07-02 NOTE — Patient Instructions (Addendum)
CHANTIX Instructions: Select a QUIT date at least 7 days in the future. You should be taking the Chantix for at least 7 days before you stop smoking. Days 1-4: take 1/2 tablet (0.5 mg) each evening Days 5-8: take 1/2 tablet (0.5 mg) each morning and each evening Days 9-12: take 1/2 tablet (0.5 mg) each morning and 1 tablet (1 mg) each evening Days 13-on: Take 1 tablet (1 mg) each morning and each evening     IF you received an x-ray today, you will receive an invoice from [facility]. Please contact [facility] at [phone] with questions or concerns regarding your invoice.   IF you received labwork today, you will receive an invoice from [facility]. Please contact [facility] at [phone] with questions or concerns regarding your invoice.   Our billing staff will not be able to assist you with questions regarding bills from these companies.  You will be contacted with the lab results as soon as they are available. The fastest way to get your results is to activate your My Chart account. Instructions are located on the last page of this paperwork. If you have not heard from us regarding the results in 2 weeks, please contact this office.

## 2016-07-02 NOTE — Progress Notes (Signed)
Patient ID: Jamie Walters, female    DOB: Sep 26, 1960, 55 y.o.   MRN: [id number]  PCP: Porfirio Oar, [name]  Subjective:   Chief Complaint  Patient presents with  . Follow-up    6 MONTH   . Medication Refill    ALL MEDS    HPI Presents for evaluation of depression and chronic pain.  Depression is well controlled with the current regimen. Increase of bupropion dose also reduced her headache frequency.  Feels like her current regimen is working well. She'd like to have a new prescription for Chantix to try to quit smoking again.   Controlled Substance Overview  Indication for chronic opioid: lumbosacral DJD Medication and dose: Hydrocodone-APAP 10/325 mg # pills per month: 120  Last UDS date: not done previously, performed today Pain contract signed (Y/N): Y 07/02/2016 Date narcotic database last reviewed (include red flags): Today, 07/02/2016. All prescriptions known to me, filled on schedule. No red flags.   Depression screen [facility] 2/9 07/02/2016 12/26/2015 05/02/2015  Decreased Interest 0 0 0  Down, Depressed, Hopeless 0 0 0  PHQ - 2 Score 0 0 0     reports that she does not drink alcohol.   reports that she does not use drugs.      Opioid Risk Tool - 07/02/16 1600      Family History of Substance Abuse   Alcohol (P)  Negative   Illegal Drugs (P)  Negative   Rx Drugs (P)  Negative     Personal History of Substance Abuse   Alcohol (P)  Negative   Illegal Drugs (P)  Negative   Rx Drugs (P)  Negative     Age   Age between 74-45 years  (P)  No     History of Preadolescent Sexual Abuse   History of Preadolescent Sexual Abuse (P)  Negative or Female     Psychological Disease   Psychological Disease (P)  Positive     Total Score   Total Score (P)  2           Review of Systems No chest pain, SOB, HA, dizziness, vision change, N/V, diarrhea, constipation, dysuria, urinary urgency or frequency, new myalgias, new arthralgias or rash.     Patient  Active Problem List   Diagnosis Date Noted  . Hyperplastic colon polyp 03/09/2014  . HTN (hypertension) 12/03/2012  . Hyperlipidemia 12/03/2012  . Mild carotid artery disease (HCC) 12/03/2012  . DDD (degenerative disc disease), lumbosacral 12/03/2012  . Depression 12/03/2012  . HA (headache) 12/03/2012     Prior to Admission medications   Medication Sig Start Date End Date Taking? Authorizing Provider  aspirin 81 MG tablet Take 81 mg by mouth daily.   Yes Historical Provider, MD  buPROPion (WELLBUTRIN XL) 150 MG 24 hr tablet TAKE THREE TABLETS BY MOUTH ONCE DAILY 06/17/16  Yes Naava Janeway, [name]  cyclobenzaprine (FLEXERIL) 10 MG tablet Take 1 tablet (10 mg total) by mouth at bedtime as needed for muscle spasms. 05/10/16  Yes Sueo Cullen, [name]  HYDROcodone-acetaminophen (NORCO) 10-325 MG tablet Take 1 tablet by mouth every 6 (six) hours as needed (pain). May fill 60 days after date on prescription 04/09/16  Yes Abbigal Radich Leotis Shames, [name]  HYDROcodone-acetaminophen (NORCO) 10-325 MG tablet Take 1 tablet by mouth every 6 (six) hours as needed (pain). May fill 30 days after date on prescription 04/09/16  Yes Colbi Schiltz, [name]  HYDROcodone-acetaminophen (NORCO) 10-325 MG tablet TAKE ONE TABLET BY MOUTH EVERY 6 HOURS AS NEEDED  FOR PAIN. 04/09/16  Yes Leonidas Boateng, [name]  hydrOXYzine (ATARAX/VISTARIL) 25 MG tablet Take 0.5-2 tablets (12.5-50 mg total) by mouth at bedtime as needed for anxiety or itching (insomnia). 12/26/15  Yes Cecillia Menees, [name]  metoprolol succinate (TOPROL-XL) 25 MG 24 hr tablet TAKE ONE TABLET BY MOUTH ONCE DAILY 02/14/16  Yes Inella Kuwahara, [name]  pravastatin (PRAVACHOL) 40 MG tablet Take 1 tablet (40 mg total) by mouth daily. 12/26/15  Yes Masud Holub, [name]  varenicline (CHANTIX) 1 MG tablet Take 1 tablet (1 mg total) by mouth 2 (two) times daily. Use for smoking cessation. Patient not taking: Reported on 07/02/2016 12/03/12   Porfirio Oar, [name]     No Known  Allergies     Objective:  Physical Exam  Constitutional: She is oriented to person, place, and time. She appears well-developed and well-nourished. She is active and cooperative. No distress.  BP 126/82 (BP Location: Right Arm, Patient Position: Sitting, Cuff Size: Small)   Pulse 84   Temp 98.4 F (36.9 C) (Oral)   Resp 18   Ht 5' 1.5" (1.562 m)   Wt 138 lb (62.6 kg)   SpO2 97%   BMI 25.65 kg/m   HENT:  Head: Normocephalic and atraumatic.  Right Ear: Hearing normal.  Left Ear: Hearing normal.  Eyes: Conjunctivae are normal. No scleral icterus.  Neck: Normal range of motion. Neck supple. No thyromegaly present.  Cardiovascular: Normal rate, regular rhythm and normal heart sounds.   Pulses:      Radial pulses are 2+ on the right side, and 2+ on the left side.  Pulmonary/Chest: Effort normal and breath sounds normal.  Musculoskeletal:       Lumbar back: She exhibits tenderness. She exhibits normal range of motion and no bony tenderness.       Back:  Lymphadenopathy:       Head (right side): No tonsillar, no preauricular, no posterior auricular and no occipital adenopathy present.       Head (left side): No tonsillar, no preauricular, no posterior auricular and no occipital adenopathy present.    She has no cervical adenopathy.       Right: No supraclavicular adenopathy present.       Left: No supraclavicular adenopathy present.  Neurological: She is alert and oriented to person, place, and time. No sensory deficit.  Skin: Skin is warm, dry and intact. No rash noted. No cyanosis or erythema. Nails show no clubbing.  Psychiatric: She has a normal mood and affect. Her speech is normal and behavior is normal.           Assessment & Plan:   1. DDD (degenerative disc disease), lumbosacral Stable. - HYDROcodone-acetaminophen (NORCO) 10-325 MG tablet; Take 1 tablet by mouth every 6 (six) hours as needed (pain). May fill 60 days after date on prescription  Dispense: 120 tablet;  Refill: 0 - HYDROcodone-acetaminophen (NORCO) 10-325 MG tablet; Take 1 tablet by mouth every 6 (six) hours as needed (pain). May fill 30 days after date on prescription  Dispense: 120 tablet; Refill: 0 - HYDROcodone-acetaminophen (NORCO) 10-325 MG tablet; TAKE ONE TABLET BY MOUTH EVERY 6 HOURS AS NEEDED FOR PAIN.  Dispense: 120 tablet; Refill: 0 - cyclobenzaprine (FLEXERIL) 10 MG tablet; Take 1 tablet (10 mg total) by mouth at bedtime as needed for muscle spasms.  Dispense: 30 tablet; Refill: 0  2. Depression, unspecified depression type Controlled. - buPROPion (WELLBUTRIN XL) 150 MG 24 hr tablet; TAKE THREE TABLETS BY MOUTH ONCE DAILY  Dispense: 90  tablet; Refill: 3  3. Nonintractable headache, unspecified chronicity pattern, unspecified headache type Resolved with increased bupropion dose.  4. Hyperlipidemia, unspecified hyperlipidemia type Last lipids 04/2015. LDL 163, HDL 54. Not fasting today. Recheck at next visit with fasting labs.  5. Essential hypertension Controlled. Continue current treatment. - metoprolol succinate (TOPROL-XL) 25 MG 24 hr tablet; Take 1 tablet (25 mg total) by mouth daily.  Dispense: 30 tablet; Refill: 12  6. Smoker Anticipatory guidance provided. Start with 1/2 tablet QD, increasing by 1/2 tablet every week up to 1 mg BID. - varenicline (CHANTIX) 1 MG tablet; Take 1 tablet (1 mg total) by mouth 2 (two) times daily. Use for smoking cessation.  Dispense: 60 tablet; Refill: 5  7. Itching Stable. Continue prn hydroxyzine. - hydrOXYzine (ATARAX/VISTARIL) 25 MG tablet; Take 0.5-2 tablets (12.5-50 mg total) by mouth at bedtime as needed for anxiety or itching (insomnia).  Dispense: 60 tablet; Refill: 3  9. Insomnia, unspecified type Controlled with PRN hydroxyzine. - hydrOXYzine (ATARAX/VISTARIL) 25 MG tablet; Take 0.5-2 tablets (12.5-50 mg total) by mouth at bedtime as needed for anxiety or itching (insomnia).  Dispense: 60 tablet; Refill: 3  10. Chronic  prescription opiate use NCCSRS reviewed today. UDS performed. Contract signed. - Pain Mgmt, Profile 6 Conf w/o mM, U  Return in about 6 months (around 12/31/2016) for re-evaluation.    Fernande Brashelle S. Dorse Locy, [name] Physician Assistant-Certified [facility]

## 2016-07-02 NOTE — Progress Notes (Signed)
Subjective:    Patient ID: Jamie Walters, female    DOB: [birth year], 55 y.o.   MRN: [id number]  Chief Complaint  Patient presents with  . Follow-up    6 MONTH   . Medication Refill    ALL MEDS    HPI: Jamie Walters is a 55 year old female who presents today for 51-month follow-up of HTN, HLD, DDD, depression, headaches, and medication refills. States the Wellbutrin started for the headaches and depression has been very beneficial. States her back pain is increased with activity, works for a [employer] and walks a lot in her daily job. Endorses that the current Norco prescription is sufficient as well as the Flexeril. Denies interest in back surgery at this time due to multiple friends who have had to have multiple back surgeries and does not want to be out of work. States depression is well-controlled with current regimen, denies anhedonia, depressed mood, SI. Blood pressure currently well-controlled on Toprol and HLD controlled on Pravastatin. Denies headaches, blurry vision, dizziness, muscles aches, or other side effects from medications. Expresses interest in quitting smoking using Chantix again. States she tried this 2 years ago and quit for 3 months. Denies adverse side effects with prior Chantix use.  No Known Allergies  Patient Active Problem List   Diagnosis Date Noted  . Smoker 07/02/2016  . Chronic prescription opiate use 07/02/2016  . Hyperplastic colon polyp 03/09/2014  . HTN (hypertension) 12/03/2012  . Hyperlipidemia 12/03/2012  . Mild carotid artery disease (HCC) 12/03/2012  . DDD (degenerative disc disease), lumbosacral 12/03/2012  . Depression 12/03/2012  . HA (headache) 12/03/2012   Prior to Admission medications   Medication Sig Start Date End Date Taking? Authorizing Provider  aspirin 81 MG tablet Take 81 mg by mouth daily.   Yes Historical Provider, MD  buPROPion (WELLBUTRIN XL) 150 MG 24 hr tablet TAKE THREE TABLETS BY MOUTH ONCE DAILY 07/02/16  Yes  [name] Jeffery, [name]  cyclobenzaprine (FLEXERIL) 10 MG tablet Take 1 tablet (10 mg total) by mouth at bedtime as needed for muscle spasms. 07/02/16  Yes [name] Jeffery, [name]  HYDROcodone-acetaminophen (NORCO) 10-325 MG tablet Take 1 tablet by mouth every 6 (six) hours as needed (pain). Walters fill 60 days after date on prescription 07/02/16  Yes [name] Leotis Shames, [name]  HYDROcodone-acetaminophen (NORCO) 10-325 MG tablet Take 1 tablet by mouth every 6 (six) hours as needed (pain). Walters fill 30 days after date on prescription 07/02/16  Yes [name] Jeffery, [name]  HYDROcodone-acetaminophen (NORCO) 10-325 MG tablet TAKE ONE TABLET BY MOUTH EVERY 6 HOURS AS NEEDED FOR PAIN. 07/02/16  Yes [name] Jeffery, [name]  hydrOXYzine (ATARAX/VISTARIL) 25 MG tablet Take 0.5-2 tablets (12.5-50 mg total) by mouth at bedtime as needed for anxiety or itching (insomnia). 07/02/16  Yes [name] Jeffery, [name]  metoprolol succinate (TOPROL-XL) 25 MG 24 hr tablet Take 1 tablet (25 mg total) by mouth daily. 07/02/16  Yes [name] Jeffery, [name]  pravastatin (PRAVACHOL) 40 MG tablet Take 1 tablet (40 mg total) by mouth daily. 12/26/15  Yes [name] Jeffery, [name]  varenicline (CHANTIX) 1 MG tablet Take 1 tablet (1 mg total) by mouth 2 (two) times daily. Use for smoking cessation. 07/02/16   Porfirio Oar, [name]     Review of Systems Prevalent review of systems mentioned above in HPI.     Objective:   Physical Exam General: Well-developed, well-nourished, appears stated age and in no apparent distress. HEENT: Normocephalic, atraumatic. Eyes PERRLA, sclera and conjunctiva clear without injection or icterus. Neck  supple. No thyromegaly or lymphadenopathy. No tracheal deviations, JVD, or carotid bruits. Pulmonary: Clear to auscultation bilaterally, no wheezes, rhonchi, or rales. No cyanosis or clubbing. Cardiovascular: Regular rate and rhythm with normal S1 and S2 without murmurs, rubs, or gallops.  Neurological: Awake, alert, oriented.    Musculoskeletal: Mild tenderness to palpation of lumbosacral spinous processes. Skin: Skin warm and dry. No rashes noted. Psychiatric: Appropriate mood and affect. Fluent speech and normal behavior.      Assessment & Plan:  1. DDD (degenerative disc disease), lumbosacral Pain well-controlled on current medication regimen. Discussed future x-ray or consult with specialist if pain increases or is no longer well-controlled on medications. - HYDROcodone-acetaminophen (NORCO) 10-325 MG tablet; Take 1 tablet by mouth every 6 (six) hours as needed (pain). Walters fill 60 days after date on prescription  Dispense: 120 tablet; Refill: 0 - HYDROcodone-acetaminophen (NORCO) 10-325 MG tablet; Take 1 tablet by mouth every 6 (six) hours as needed (pain). Walters fill 30 days after date on prescription  Dispense: 120 tablet; Refill: 0 - HYDROcodone-acetaminophen (NORCO) 10-325 MG tablet; TAKE ONE TABLET BY MOUTH EVERY 6 HOURS AS NEEDED FOR PAIN.  Dispense: 120 tablet; Refill: 0 - cyclobenzaprine (FLEXERIL) 10 MG tablet; Take 1 tablet (10 mg total) by mouth at bedtime as needed for muscle spasms.  Dispense: 30 tablet; Refill: 0  2. Depression, unspecified depression type Well-controlled on medication. Continue Wellbutrin 150 mg, 3 tablets by mouth QD - buPROPion (WELLBUTRIN XL) 150 MG 24 hr tablet; TAKE THREE TABLETS BY MOUTH ONCE DAILY  Dispense: 90 tablet; Refill: 3  3. Nonintractable headache, unspecified chronicity pattern, unspecified headache type Well-controlled on Wellbutrin per patient, states many fewer headaches. Continue Wellbutrin 150 mg, 3 tablets by mouth QD  4. Hyperlipidemia, unspecified hyperlipidemia type Currently well-controlled on medication. Continue Pravastatin 40 mg daily.  5. Essential hypertension Currently well-controlled on medication. Continue Metoprolol Succinate. - metoprolol succinate (TOPROL-XL) 25 MG 24 hr tablet; Take 1 tablet (25 mg total) by mouth daily.  Dispense: 30  tablet; Refill: 12  6. Smoker Expressed interest in smoking cessation. Chantix prescribed and medication instructions as well as adverse drug reactions reviewed. Patient has used Chantix in past.  7. Tobacco abuse Expressed interest in smoking cessation. Chantix prescribed and medication instructions as well as adverse drug reactions reviewed. Patient has used Chantix in past. - varenicline (CHANTIX) 1 MG tablet; Take 1 tablet (1 mg total) by mouth 2 (two) times daily. Use for smoking cessation.  Dispense: 60 tablet; Refill: 5  8. Itching Currently well-controlled on medication, continue Hydroxyzine. - hydrOXYzine (ATARAX/VISTARIL) 25 MG tablet; Take 0.5-2 tablets (12.5-50 mg total) by mouth at bedtime as needed for anxiety or itching (insomnia).  Dispense: 60 tablet; Refill: 3  9. Insomnia, unspecified type Currently well-controlled on medication, contine Hydroxyzine. - hydrOXYzine (ATARAX/VISTARIL) 25 MG tablet; Take 0.5-2 tablets (12.5-50 mg total) by mouth at bedtime as needed for anxiety or itching (insomnia).  Dispense: 60 tablet; Refill: 3  10. Chronic prescription opiate use Chronic prescription opiate use for DDD. Pain contract reviewed with patient, initialed, signed, and placed in chart. UDS collected today. [place] opiate reporting system reviewed and demonstrated prescriptions by 1 provider, Porfirio Oarhelle Jeffery, [name]. Opiate risk tool placed patient in low risk category, plan for annual UDS and review of  opiate reporting system. Discussed at length with patient and expressed understanding. - Pain Mgmt, Profile 6 Conf w/o mM, U

## 2016-07-06 LAB — PAIN MGMT, PROFILE 6 CONF W/O MM, U
6 ACETYLMORPHINE: NEGATIVE ng/mL (ref <10)
ALCOHOL METABOLITES: NEGATIVE ng/mL (ref <500)
Amphetamines: NEGATIVE ng/mL (ref <500)
BENZODIAZEPINES: NEGATIVE ng/mL (ref <100)
Barbiturates: NEGATIVE ng/mL (ref <300)
CODEINE: NEGATIVE ng/mL (ref <50)
Cocaine Metabolite: NEGATIVE ng/mL (ref <150)
Creatinine: 29.9 mg/dL (ref >=20.0)
Hydrocodone: 1238 ng/mL — ABNORMAL HIGH (ref <50)
Hydromorphone: NEGATIVE ng/mL (ref <50)
MORPHINE: NEGATIVE ng/mL (ref <50)
Marijuana Metabolite: NEGATIVE ng/mL (ref <20)
Methadone Metabolite: NEGATIVE ng/mL (ref <100)
Norhydrocodone: 1879 ng/mL — ABNORMAL HIGH (ref <50)
OPIATES: POSITIVE ng/mL — AB (ref <100)
OXIDANT: NEGATIVE ug/mL (ref <200)
Oxycodone: NEGATIVE ng/mL (ref <100)
Phencyclidine: NEGATIVE ng/mL (ref <25)
Please note:: 0
pH: 6.55 (ref 4.5–9.0)

## 2016-10-08 ENCOUNTER — Other Ambulatory Visit: Payer: Self-pay

## 2016-10-08 DIAGNOSIS — M5137 Other intervertebral disc degeneration, lumbosacral region: Secondary | ICD-10-CM

## 2016-10-08 NOTE — Telephone Encounter (Signed)
Pt  Is needing to get a refill on her hydrocodone  Best number [phone]

## 2016-10-09 MED ORDER — HYDROCODONE-ACETAMINOPHEN 10-325 MG PO TABS: ORAL_TABLET | ORAL | 0 refills | Status: DC

## 2016-10-09 MED ORDER — HYDROCODONE-ACETAMINOPHEN 10-325 MG PO TABS: 1.0000 | ORAL_TABLET | Freq: Four times a day (QID) | ORAL | 0 refills | Status: DC | PRN

## 2016-10-09 NOTE — Telephone Encounter (Signed)
Meds ordered this encounter  Medications  . HYDROcodone-acetaminophen (NORCO) 10-325 MG tablet    Sig: Take 1 tablet by mouth every 6 (six) hours as needed (pain). May fill 60 days after date on prescription    Dispense:  120 tablet    Refill:  0    Order Specific Question:   Supervising Provider    Answer:   SHAW, EVA N [[id number]]  . HYDROcodone-acetaminophen (NORCO) 10-325 MG tablet    Sig: Take 1 tablet by mouth every 6 (six) hours as needed (pain). May fill 30 days after date on prescription    Dispense:  120 tablet    Refill:  0    Order Specific Question:   Supervising Provider    Answer:   SHAW, EVA N [[id number]]  . HYDROcodone-acetaminophen (NORCO) 10-325 MG tablet    Sig: TAKE ONE TABLET BY MOUTH EVERY 6 HOURS AS NEEDED FOR PAIN.    Dispense:  120 tablet    Refill:  0    Order Specific Question:   Supervising Provider    Answer:   SHAW, EVA N [[id number]]    

## 2016-10-10 NOTE — Telephone Encounter (Signed)
Picked up.

## 2016-10-28 ENCOUNTER — Telehealth: Payer: Self-pay

## 2016-10-28 DIAGNOSIS — M5137 Other intervertebral disc degeneration, lumbosacral region: Secondary | ICD-10-CM

## 2016-10-28 MED ORDER — CYCLOBENZAPRINE HCL 10 MG PO TABS: 10.0000 mg | ORAL_TABLET | Freq: Every evening | ORAL | 0 refills | Status: DC | PRN

## 2016-10-28 NOTE — Telephone Encounter (Signed)
06/2016 last ov and refill 

## 2016-10-28 NOTE — Telephone Encounter (Signed)
Meds ordered this encounter  Medications  . cyclobenzaprine (FLEXERIL) 10 MG tablet    Sig: Take 1 tablet (10 mg total) by mouth at bedtime as needed for muscle spasms.    Dispense:  30 tablet    Refill:  0

## 2016-10-28 NOTE — Telephone Encounter (Signed)
Pt is needing a refill on her muscle relaxer due to her neck pain   Best number [phone]

## 2016-11-14 ENCOUNTER — Other Ambulatory Visit: Payer: Self-pay | Admitting: Physician Assistant

## 2016-11-14 DIAGNOSIS — F32A Depression, unspecified: Secondary | ICD-10-CM

## 2016-11-14 DIAGNOSIS — F329 Major depressive disorder, single episode, unspecified: Secondary | ICD-10-CM

## 2016-12-16 ENCOUNTER — Other Ambulatory Visit: Payer: Self-pay | Admitting: Physician Assistant

## 2016-12-16 DIAGNOSIS — F329 Major depressive disorder, single episode, unspecified: Secondary | ICD-10-CM

## 2016-12-16 DIAGNOSIS — F32A Depression, unspecified: Secondary | ICD-10-CM

## 2016-12-23 ENCOUNTER — Encounter: Payer: Self-pay | Admitting: Physician Assistant

## 2016-12-23 ENCOUNTER — Ambulatory Visit (INDEPENDENT_AMBULATORY_CARE_PROVIDER_SITE_OTHER): Payer: PRIVATE HEALTH INSURANCE | Admitting: Physician Assistant

## 2016-12-23 ENCOUNTER — Ambulatory Visit (INDEPENDENT_AMBULATORY_CARE_PROVIDER_SITE_OTHER): Payer: PRIVATE HEALTH INSURANCE

## 2016-12-23 VITALS — BP 130/90 | HR 75 | Temp 98.7°F | Resp 16 | Ht 61.0 in | Wt 144.0 lb

## 2016-12-23 DIAGNOSIS — I10 Essential (primary) hypertension: Secondary | ICD-10-CM | POA: Diagnosis not present

## 2016-12-23 DIAGNOSIS — Z79891 Long term (current) use of opiate analgesic: Secondary | ICD-10-CM

## 2016-12-23 DIAGNOSIS — F32A Depression, unspecified: Secondary | ICD-10-CM

## 2016-12-23 DIAGNOSIS — M5137 Other intervertebral disc degeneration, lumbosacral region: Secondary | ICD-10-CM

## 2016-12-23 DIAGNOSIS — E785 Hyperlipidemia, unspecified: Secondary | ICD-10-CM | POA: Diagnosis not present

## 2016-12-23 DIAGNOSIS — F329 Major depressive disorder, single episode, unspecified: Secondary | ICD-10-CM

## 2016-12-23 DIAGNOSIS — I739 Peripheral vascular disease, unspecified: Secondary | ICD-10-CM

## 2016-12-23 DIAGNOSIS — I779 Disorder of arteries and arterioles, unspecified: Secondary | ICD-10-CM

## 2016-12-23 DIAGNOSIS — Z720 Tobacco use: Secondary | ICD-10-CM | POA: Diagnosis not present

## 2016-12-23 DIAGNOSIS — R5383 Other fatigue: Secondary | ICD-10-CM

## 2016-12-23 DIAGNOSIS — R51 Headache: Secondary | ICD-10-CM | POA: Diagnosis not present

## 2016-12-23 DIAGNOSIS — R519 Headache, unspecified: Secondary | ICD-10-CM

## 2016-12-23 MED ORDER — HYDROCODONE-ACETAMINOPHEN 10-325 MG PO TABS: 1.0000 | ORAL_TABLET | Freq: Four times a day (QID) | ORAL | 0 refills | Status: DC | PRN

## 2016-12-23 MED ORDER — HYDROCODONE-ACETAMINOPHEN 10-325 MG PO TABS: ORAL_TABLET | ORAL | 0 refills | Status: DC

## 2016-12-23 MED ORDER — VARENICLINE TARTRATE 1 MG PO TABS: 1.0000 mg | ORAL_TABLET | Freq: Two times a day (BID) | ORAL | 5 refills | Status: DC

## 2016-12-23 MED ORDER — GABAPENTIN 300 MG PO CAPS: 300.0000 mg | ORAL_CAPSULE | Freq: Three times a day (TID) | ORAL | 3 refills | Status: DC

## 2016-12-23 MED ORDER — BUPROPION HCL ER (XL) 150 MG PO TB24: 450.0000 mg | ORAL_TABLET | Freq: Every day | ORAL | 3 refills | Status: DC

## 2016-12-23 MED ORDER — PRAVASTATIN SODIUM 40 MG PO TABS: 40.0000 mg | ORAL_TABLET | Freq: Every day | ORAL | 3 refills | Status: DC

## 2016-12-23 NOTE — Patient Instructions (Addendum)
Initial Chantix instructions: Set a quit date at least 7 days in the future. You should continue smoking for at least 7 days while taking the Chantix. Take 1/2 tablet once daily for 4 days, then 1/2 tablet twice daily for 4 days, then 1/2 tablet each morning and 1 whole tablet each evening for 4 days, then take 1 whole tablet twice daily.  Starting the gabapentin: start with one at bedtime. If you tolerate that well, increase to 2 at bedtime. If you tolerate that well, add one in the morning, as long as it doesn't make you drowsy. You can add another dose midday.     IF you received an x-ray today, you will receive an invoice from [facility]. Please contact [facility] at [phone] with questions or concerns regarding your invoice.   IF you received labwork today, you will receive an invoice from [place] [facility]. Please contact [facility] at [phone] with questions or concerns regarding your invoice.   Our billing staff will not be able to assist you with questions regarding bills from these companies.  You will be contacted with the lab results as soon as they are available. The fastest way to get your results is to activate your My Chart account. Instructions are located on the last page of this paperwork. If you have not heard from [place] regarding the results in 2 weeks, please contact this office.

## 2016-12-23 NOTE — Progress Notes (Signed)
THIS NOTE IS USED FOR EDUCATIONAL PURPOSES ONLY!!!   Name: Jamie Walters  DOB: Dec 17, 1960  Age: 56 y.o. Sex: female  CC:  Chief Complaint  Patient presents with  . Medication Refill    PCP: Porfirio Oar, [name]  HPI: Patient reports today for medication refill and complaints of restless leg and fatigue.   Patient reports that the restless leg has been going on for 2-3 months. Patient reports she can easily fall asleep and then her left leg becomes numb at night causing her to awaken. She reports her leg becomes restless when she is sitting on the couch and has to get up and shake out her leg for it to go away. It is happening ever day and multiple times every day. Sometimes the restless leg wakes her up at night. Patient's chronic history of back pain is unchanged from previous visits. No loss of bowel/bladder movements. No saddle anesthesia.   Patient reports she has been feeling "drained" and feels like she doesn't want to do things that she used to do. She reports an increased in sweet cravings. Patient denies any depression feels. She's currently on Wellbutrin for migraine prophylaxis and thinks this is also helping her depressed moods.   Patient reports she has cut down her smoking to .25 packs daily. Patient reports that she has received a Rx for Chantix and is considering starting that again and would like instructions on how to take the medications. Patient reports she stopped smoking for 3 months and then her mother passed away and started smoking again.    ROS:  Constitutional: Negative for activity change, appetite change, fatigue and unexpected weight change.  HENT: Negative for congestion, dental problem, ear pain, hearing loss, mouth sores, postnasal drip, rhinorrhea, sneezing, sore throat, tinnitus and trouble swallowing.   Eyes: Negative for photophobia, pain, redness and visual disturbance.  Respiratory: Negative for cough, chest tightness and shortness of breath.    Cardiovascular: Negative for chest pain, palpitations and leg swelling.  Gastrointestinal: Negative for abdominal pain, blood in stool, constipation, diarrhea, nausea and vomiting.  Genitourinary: Negative for dysuria, frequency, hematuria and urgency.  Musculoskeletal: Negative for arthralgias, gait problem, myalgias and neck stiffness.  Skin: Negative for rash.  Neurological: Negative for dizziness, speech difficulty, weakness, light-headedness, numbness and headaches.  Hematological: Negative for adenopathy.  Psychiatric/Behavioral: Negative for confusion and sleep disturbance. The patient is not nervous/anxious.    PMH:  Patient Active Problem List   Diagnosis Date Noted  . Smoker 07/02/2016  . Chronic prescription opiate use 07/02/2016  . Hyperplastic colon polyp 03/09/2014  . HTN (hypertension) 12/03/2012  . Hyperlipidemia 12/03/2012  . Mild carotid artery disease (HCC) 12/03/2012  . DDD (degenerative disc disease), lumbosacral 12/03/2012  . Depression 12/03/2012  . HA (headache) 12/03/2012    Allergies: No Known Allergies  Medications:  Current Outpatient Prescriptions on File Prior to Visit  Medication Sig Dispense Refill  . aspirin 81 MG tablet Take 81 mg by mouth daily.    Marland Kitchen buPROPion (WELLBUTRIN XL) 150 MG 24 hr tablet TAKE 3 TABLETS BY MOUTH ONCE DAILY 30 tablet 0  . cyclobenzaprine (FLEXERIL) 10 MG tablet Take 1 tablet (10 mg total) by mouth at bedtime as needed for muscle spasms. 30 tablet 0  . HYDROcodone-acetaminophen (NORCO) 10-325 MG tablet Take 1 tablet by mouth every 6 (six) hours as needed (pain). May fill 60 days after date on prescription 120 tablet 0  . HYDROcodone-acetaminophen (NORCO) 10-325 MG tablet Take 1 tablet by mouth  every 6 (six) hours as needed (pain). May fill 30 days after date on prescription 120 tablet 0  . HYDROcodone-acetaminophen (NORCO) 10-325 MG tablet TAKE ONE TABLET BY MOUTH EVERY 6 HOURS AS NEEDED FOR PAIN. 120 tablet 0  . hydrOXYzine  (ATARAX/VISTARIL) 25 MG tablet Take 0.5-2 tablets (12.5-50 mg total) by mouth at bedtime as needed for anxiety or itching (insomnia). 60 tablet 3  . metoprolol succinate (TOPROL-XL) 25 MG 24 hr tablet Take 1 tablet (25 mg total) by mouth daily. 30 tablet 12  . pravastatin (PRAVACHOL) 40 MG tablet Take 1 tablet (40 mg total) by mouth daily. 90 tablet 3  . varenicline (CHANTIX) 1 MG tablet Take 1 tablet (1 mg total) by mouth 2 (two) times daily. Use for smoking cessation. 60 tablet 5   No current facility-administered medications on file prior to visit.     PE:  GS: WDWN female sitting on exam table in NAD.  Vitals: BP (!) 152/85 (BP Location: Right Arm, Patient Position: Sitting, Cuff Size: Normal)   Pulse 75   Temp 98.7 F (37.1 C) (Oral)   Resp 16   Ht  (1.549 m)   Wt 144 lb (65.3 kg)   SpO2 97%   BMI 27.21 kg/m  HEENT: Normocephalic, atruamatic. PEARRL. No cervical lymphadenopathy. No thyroid nodules, normal size, and equal bilaterally.   Cardiovascular: RRR. No S3 or S4. No murmurs, rubs, or gallops. Pulses 2+ and equal bilateral in the upper and lower extremities. No pitting edema. No varicosities, clubbing, or cyanosis.  Pulm: CTA bilaterally. No expiratory muscle use while breathing.  GI: +BS. NTND. No rigidity or guarding. No rebound tenderness.  MSK: ttp about the lumbar spinus processes and around the paraspinous muscles. 5/5 strength. Sensation intact.  Neuro: CN 2-12 grossly intact.  Psych: A&O x 4. Mood and affect appropriate for situation. 2+ Reflexes in lower extremity.  Skin: Warm and dry. No rashes or excoriations on exposed skin.   A&P:  1. Essential hypertension - Controlled- continue current medications Plan: CBC with Differential/Platelet  2. Hyperlipidemia, unspecified hyperlipidemia type - repeat labs today. Patient not fasting  Plan:  -Labs: Comprehensive metabolic panel, Lipid panel  3. Depression, unspecified depression type - controlled on  Wellbutrin  4. Nonintractable headache, unspecified chronicity pattern, unspecified headache type -  Controlled on Wellbutrin   5. Tobacco abuse - patient has quit before for 3 months and began again after the loss of her mother. Currently smoking .25 packs daily.  Plan:  Pharm: varenicline (CHANTIX) 1 MG tablet Education: Chantix instructions given.   6. Chronic prescription opiate use - controlled  7. Mild carotid artery disease (HCC)  8. Fatigue, unspecified type - possible etiologies include: muscle relaxer or lack of sleep in recent months. Plan:  -Labs: TSH, T4, free  9. DDD (degenerative disc disease), lumbosacral - Continued. most recent imaging in 2008, Repeat XR today. MRI of spine ordered. Plan:  -Imaging: DG Lumbar Spine Complete: Severe L5-S1 degenerative discs without acute fracture deformity/malalignment. Moderate arthrosclerosis.  MRI of lumbar spine ordered today- would like her image done at [facility]      Respectfully,  Camillia Herter, PA-S2

## 2016-12-23 NOTE — Progress Notes (Signed)
Patient ID: Jamie Walters, female    DOB: 24-Feb-1961, 56 y.o.   MRN: [id number]  PCP: Porfirio Oar, [name]  Chief Complaint  Patient presents with  . Medication Refill    Subjective:   Presents for medication refills and evaluation of fatigue and restless leg symptoms.  For the past 2-3 months she has awoken during the night with the sensation of numbness in the LEFT leg. She often feels that she must move the leg when she is sitting on the couch, such that she gets up to walk around or shake it.  No loss of bowel/bladder control. No saddle anesthesia. No worsening of chronic, long-standing LBP. NCCSRS reviewed. All prescriptions known to me. Continues on hydrocodone.   Increased fatigue and lack of motivation. Increased cravings for sweets. Denies depression, which is well controlled with Wellbutrin, which also helps to prevent migraine headache.  Has cut her smoking down to 0.25 ppd. I previously prescribed Chantix, which she'd like to take to help her quit smoking completely, but she's lost the initial dosing instructions I provided.   Tolerating metoprolol and pravastatin well.    Review of Systems Constitutional: Negative for activity change, appetite change, fatigueand unexpected weight change.  HENT: Negative for congestion, dental problem, ear pain, hearing loss, mouth sores, postnasal drip, rhinorrhea, sneezing, sore throat, tinnitusand trouble swallowing.  Eyes: Negative for photophobia, pain, rednessand visual disturbance.  Respiratory: Negative for cough, chest tightnessand shortness of breath.  Cardiovascular: Negative for chest pain, palpitationsand leg swelling.  Gastrointestinal: Negative for abdominal pain, blood in stool, constipation, diarrhea, nauseaand vomiting.  Genitourinary: Negative for dysuria, frequency, hematuriaand urgency.  Musculoskeletal: Negative for arthralgias, gait problem, myalgiasand neck stiffness.  Skin: Negative for  rash.  Neurological: Negative for dizziness, speech difficulty, weakness, light-headedness, numbnessand headaches.  Hematological: Negative for adenopathy.  Psychiatric/Behavioral: Negative for confusionand sleep disturbance. The patient is not nervous/anxious.     Patient Active Problem List   Diagnosis Date Noted  . Smoker 07/02/2016  . Chronic prescription opiate use 07/02/2016  . Hyperplastic colon polyp 03/09/2014  . HTN (hypertension) 12/03/2012  . Hyperlipidemia 12/03/2012  . Mild carotid artery disease (HCC) 12/03/2012  . DDD (degenerative disc disease), lumbosacral 12/03/2012  . Depression 12/03/2012  . HA (headache) 12/03/2012     Prior to Admission medications   Medication Sig Start Date End Date Taking? Authorizing Provider  aspirin 81 MG tablet Take 81 mg by mouth daily.   Yes Historical Provider, MD  buPROPion (WELLBUTRIN XL) 150 MG 24 hr tablet TAKE 3 TABLETS BY MOUTH ONCE DAILY 12/16/16  Yes Smitty Ackerley, [name]  cyclobenzaprine (FLEXERIL) 10 MG tablet Take 1 tablet (10 mg total) by mouth at bedtime as needed for muscle spasms. 10/28/16  Yes Quay Simkin, [name]  HYDROcodone-acetaminophen (NORCO) 10-325 MG tablet Take 1 tablet by mouth every 6 (six) hours as needed (pain). May fill 60 days after date on prescription 10/09/16  Yes Arnaldo Heffron Leotis Shames, [name]  HYDROcodone-acetaminophen (NORCO) 10-325 MG tablet Take 1 tablet by mouth every 6 (six) hours as needed (pain). May fill 30 days after date on prescription 10/09/16  Yes Markell Schrier Leotis Shames, [name]  HYDROcodone-acetaminophen (NORCO) 10-325 MG tablet TAKE ONE TABLET BY MOUTH EVERY 6 HOURS AS NEEDED FOR PAIN. 10/09/16  Yes Alazia Crocket, [name]  hydrOXYzine (ATARAX/VISTARIL) 25 MG tablet Take 0.5-2 tablets (12.5-50 mg total) by mouth at bedtime as needed for anxiety or itching (insomnia). 07/02/16  Yes Chisom Aust, [name]  metoprolol succinate (TOPROL-XL) 25 MG 24 hr  tablet Take 1 tablet (25 mg total) by mouth daily. 07/02/16  Yes  Cortlandt Capuano, [name]  pravastatin (PRAVACHOL) 40 MG tablet Take 1 tablet (40 mg total) by mouth daily. 12/26/15  Yes Shadana Pry, [name]  varenicline (CHANTIX) 1 MG tablet Take 1 tablet (1 mg total) by mouth 2 (two) times daily. Use for smoking cessation. 07/02/16  Yes Tammi Boulier, [name]     No Known Allergies     Objective:  Physical Exam  Constitutional: She is oriented to person, place, and time. She appears well-developed and well-nourished. She is active and cooperative. No distress.  BP 130/90   Pulse 75   Temp 98.7 F (37.1 C) (Oral)   Resp 16   Ht  (1.549 m)   Wt 144 lb (65.3 kg)   SpO2 97%   BMI 27.21 kg/m   HENT:  Head: Normocephalic and atraumatic.  Right Ear: Hearing normal.  Left Ear: Hearing normal.  Eyes: Conjunctivae are normal. No scleral icterus.  Neck: Normal range of motion. Neck supple. No thyromegaly present.  Cardiovascular: Normal rate, regular rhythm and normal heart sounds.   Pulses:      Radial pulses are 2+ on the right side, and 2+ on the left side.  Pulmonary/Chest: Effort normal and breath sounds normal.  Musculoskeletal:       Cervical back: Normal.       Thoracic back: Normal.       Lumbar back: She exhibits decreased range of motion, tenderness, bony tenderness and pain. She exhibits no swelling, no edema, no deformity, no laceration, no spasm and normal pulse.  Lymphadenopathy:       Head (right side): No tonsillar, no preauricular, no posterior auricular and no occipital adenopathy present.       Head (left side): No tonsillar, no preauricular, no posterior auricular and no occipital adenopathy present.    She has no cervical adenopathy.       Right: No supraclavicular adenopathy present.       Left: No supraclavicular adenopathy present.  Neurological: She is alert and oriented to person, place, and time. She has normal strength. No sensory deficit.  Skin: Skin is warm, dry and intact. No rash noted. No cyanosis or erythema. Nails  show no clubbing.  Psychiatric: She has a normal mood and affect. Her speech is normal and behavior is normal.       Dg Lumbar Spine Complete  Result Date: 12/23/2016 CLINICAL DATA:  Chronic low back pain, now with RIGHT lower extremity paresthesias. EXAM: LUMBAR SPINE - COMPLETE 4+ VIEW COMPARISON:  MRI of the lumbar spine report dated July 17, 2011 though images are not available for direct comparison. FINDINGS: Five non rib-bearing lumbar-type vertebral bodies are intact and aligned with maintenance of the lumbar lordosis. Severe L5-S1 disc height loss with endplate spurring compatible with degenerative disc. Mild L1-2 thru L3-4 ventral endplate spurring. Moderate lower lumbar facet arthropathy. No destructive bony lesions. Sacroiliac joints are symmetric. Included prevertebral and paraspinal soft tissue planes are non-suspicious. Moderate vascular calcifications. IMPRESSION: Severe L5-S1 degenerative discs without acute fracture deformity or malalignment. Moderate atherosclerosis. Electronically Signed   By: Awilda Metro M.D.   On: 12/23/2016 14:41       Assessment & Plan:   Problem List Items Addressed This Visit    HTN (hypertension) - Primary (Chronic)    Controlled. Continue current treatment.      Relevant Medications   pravastatin (PRAVACHOL) 40 MG tablet   Other Relevant Orders  CBC with Differential/Platelet (Completed)   Hyperlipidemia (Chronic)    Again, not fasting. Update labs. Interpret with knowledge that she is not fasting.      Relevant Medications   pravastatin (PRAVACHOL) 40 MG tablet   Other Relevant Orders   Comprehensive metabolic panel (Completed)   Lipid panel (Completed)   Mild carotid artery disease (HCC) (Chronic)    Encouraged smoking cessation. Maintain HTN and lipid control.      Relevant Medications   pravastatin (PRAVACHOL) 40 MG tablet   DDD (degenerative disc disease), lumbosacral (Chronic)    Now with radiculopathy to the leg. MRI  for additional evaluation. Start gabapentin.      Relevant Medications   HYDROcodone-acetaminophen (NORCO) 10-325 MG tablet   HYDROcodone-acetaminophen (NORCO) 10-325 MG tablet   HYDROcodone-acetaminophen (NORCO) 10-325 MG tablet   gabapentin (NEURONTIN) 300 MG capsule   Other Relevant Orders   DG Lumbar Spine Complete (Completed)   MR Lumbar Spine Wo Contrast   Depression (Chronic)    Well controlled on Wellbutrin.      Relevant Medications   buPROPion (WELLBUTRIN XL) 150 MG 24 hr tablet   HA (headache) (Chronic)    Well controlled on Wellbutrin.      Relevant Medications   HYDROcodone-acetaminophen (NORCO) 10-325 MG tablet   HYDROcodone-acetaminophen (NORCO) 10-325 MG tablet   HYDROcodone-acetaminophen (NORCO) 10-325 MG tablet   buPROPion (WELLBUTRIN XL) 150 MG 24 hr tablet   gabapentin (NEURONTIN) 300 MG capsule   Tobacco abuse    Has cut back considerably. Proceed with Chantix. Initial instructions provided.      Relevant Medications   varenicline (CHANTIX) 1 MG tablet   Chronic prescription opiate use    Low risk. Aware of significant risks of chronic opiate use in general. MRI to assess progressive back symptoms. Gabapentin added. May benefit from additional treatment modalities.       Other Visit Diagnoses    Fatigue, unspecified type       Relevant Orders   TSH (Completed)   T4, free (Completed)       Return in about 6 months (around 06/24/2017) for re-evaluation and fasting labs.   Fernande Bras, [name] Primary Care at [facility]

## 2016-12-24 LAB — COMPREHENSIVE METABOLIC PANEL
ALK PHOS: 89 IU/L (ref 39–117)
ALT: 18 IU/L (ref 0–32)
AST: 22 IU/L (ref 0–40)
Albumin/Globulin Ratio: 1.9 (ref 1.2–2.2)
Albumin: 4.5 g/dL (ref 3.5–5.5)
BUN/Creatinine Ratio: 14 (ref 9–23)
BUN: 10 mg/dL (ref 6–24)
CHLORIDE: 102 mmol/L (ref 96–106)
CO2: 24 mmol/L (ref 18–29)
CREATININE: 0.73 mg/dL (ref 0.57–1.00)
Calcium: 10 mg/dL (ref 8.7–10.2)
GFR calc Af Amer: 106 mL/min/{1.73_m2} (ref >59)
GFR calc non Af Amer: 92 mL/min/{1.73_m2} (ref >59)
GLOBULIN, TOTAL: 2.4 g/dL (ref 1.5–4.5)
GLUCOSE: 84 mg/dL (ref 65–99)
Potassium: 4 mmol/L (ref 3.5–5.2)
SODIUM: 142 mmol/L (ref 134–144)
Total Protein: 6.9 g/dL (ref 6.0–8.5)

## 2016-12-24 LAB — CBC WITH DIFFERENTIAL/PLATELET
BASOS ABS: 0 10*3/uL (ref 0.0–0.2)
Basos: 0 %
EOS (ABSOLUTE): 0.1 10*3/uL (ref 0.0–0.4)
EOS: 1 %
Hematocrit: 39.4 % (ref 34.0–46.6)
Hemoglobin: 13.1 g/dL (ref 11.1–15.9)
IMMATURE GRANULOCYTES: 0 %
Immature Grans (Abs): 0 10*3/uL (ref 0.0–0.1)
LYMPHS ABS: 4.3 10*3/uL — AB (ref 0.7–3.1)
Lymphs: 44 %
MCH: 30.8 pg (ref 26.6–33.0)
MCHC: 33.2 g/dL (ref 31.5–35.7)
MCV: 93 fL (ref 79–97)
MONOS ABS: 1.1 10*3/uL — AB (ref 0.1–0.9)
Monocytes: 11 %
NEUTROS PCT: 44 %
Neutrophils Absolute: 4.3 10*3/uL (ref 1.4–7.0)
PLATELETS: 294 10*3/uL (ref 150–379)
RBC: 4.26 x10E6/uL (ref 3.77–5.28)
RDW: 14.2 % (ref 12.3–15.4)
WBC: 9.8 10*3/uL (ref 3.4–10.8)

## 2016-12-24 LAB — LIPID PANEL
CHOLESTEROL TOTAL: 206 mg/dL — AB (ref 100–199)
Chol/HDL Ratio: 4 ratio (ref 0.0–4.4)
HDL: 51 mg/dL (ref >39)
LDL Calculated: 126 mg/dL — ABNORMAL HIGH (ref 0–99)
Triglycerides: 143 mg/dL (ref 0–149)
VLDL Cholesterol Cal: 29 mg/dL (ref 5–40)

## 2016-12-24 LAB — TSH: TSH: 4.01 u[IU]/mL (ref 0.450–4.500)

## 2016-12-24 LAB — T4, FREE: FREE T4: 1.22 ng/dL (ref 0.82–1.77)

## 2016-12-27 ENCOUNTER — Encounter: Payer: Self-pay | Admitting: Physician Assistant

## 2017-01-02 NOTE — Assessment & Plan Note (Signed)
Encouraged smoking cessation. Maintain HTN and lipid control.

## 2017-01-02 NOTE — Assessment & Plan Note (Signed)
Again, not fasting. Update labs. Interpret with knowledge that she is not fasting.

## 2017-01-02 NOTE — Assessment & Plan Note (Addendum)
Low risk. Aware of significant risks of chronic opiate use in general. MRI to assess progressive back symptoms. Gabapentin added. May benefit from additional treatment modalities.

## 2017-01-02 NOTE — Assessment & Plan Note (Signed)
Controlled. Continue current treatment. 

## 2017-01-02 NOTE — Assessment & Plan Note (Signed)
Has cut back considerably. Proceed with Chantix. Initial instructions provided.

## 2017-01-02 NOTE — Assessment & Plan Note (Addendum)
Now with radiculopathy to the leg. MRI for additional evaluation. Start gabapentin.

## 2017-01-02 NOTE — Assessment & Plan Note (Signed)
Well controlled on Wellbutrin

## 2017-01-13 LAB — HM MAMMOGRAPHY

## 2017-03-31 ENCOUNTER — Telehealth: Payer: Self-pay | Admitting: Physician Assistant

## 2017-03-31 NOTE — Telephone Encounter (Signed)
Pt is calling to see if [name] could refill her muscle relaxers and hydrocodone prescriptions.  Pt states that she is having a lot of pain in her hip lately.  Please advise [phone]

## 2017-03-31 NOTE — Telephone Encounter (Signed)
Would you like her to come in for a visit? Please advise..Thanks!!!

## 2017-04-07 NOTE — Telephone Encounter (Signed)
Pt called to check the status of req.

## 2017-04-10 ENCOUNTER — Encounter: Payer: Self-pay | Admitting: Physician Assistant

## 2017-04-10 ENCOUNTER — Ambulatory Visit (INDEPENDENT_AMBULATORY_CARE_PROVIDER_SITE_OTHER): Payer: PRIVATE HEALTH INSURANCE | Admitting: Physician Assistant

## 2017-04-10 VITALS — BP 114/72 | HR 73 | Temp 98.7°F | Resp 18 | Ht 61.0 in | Wt 142.4 lb

## 2017-04-10 DIAGNOSIS — M5137 Other intervertebral disc degeneration, lumbosacral region: Secondary | ICD-10-CM

## 2017-04-10 DIAGNOSIS — I10 Essential (primary) hypertension: Secondary | ICD-10-CM

## 2017-04-10 DIAGNOSIS — Z720 Tobacco use: Secondary | ICD-10-CM | POA: Diagnosis not present

## 2017-04-10 DIAGNOSIS — Z79891 Long term (current) use of opiate analgesic: Secondary | ICD-10-CM | POA: Diagnosis not present

## 2017-04-10 MED ORDER — HYDROCODONE-ACETAMINOPHEN 10-325 MG PO TABS: 1.0000 | ORAL_TABLET | Freq: Four times a day (QID) | ORAL | 0 refills | Status: DC | PRN

## 2017-04-10 MED ORDER — PREGABALIN 75 MG PO CAPS: 75.0000 mg | ORAL_CAPSULE | Freq: Two times a day (BID) | ORAL | 3 refills | Status: DC

## 2017-04-10 MED ORDER — HYDROCODONE-ACETAMINOPHEN 10-325 MG PO TABS: ORAL_TABLET | ORAL | 0 refills | Status: DC

## 2017-04-10 NOTE — Progress Notes (Signed)
Patient ID: Jamie Walters, female    DOB: 07/11/1961, 56 y.o.   MRN: [id number]  PCP: Porfirio OarJeffery, Leovanni Bjorkman, [name]  Chief Complaint  Patient presents with  . Medication Refill    NORCO 10-325, Gabapentin 300 MG  . Hip Pain    per pt states that her left hip is hurting more than last time. Pt states it hurts walk and has trouble walking upstairs.    Subjective:   Presents for evaluation of low back and hip pain.  Her pain is long-standing, due to lumbosacral DDD, but worsening. Previously she's had good control with her current regimen of TID hydrocodone 10 mg. At her visit 12/23/2016, we added gabapentin.  [place] Trouble going up and down stairs. Feels "locked" when she gets up in the morning on the LEFT, and usually eases up as she moves around. At night, both hips and legs "go numb." Feels like something is squeezing her legs. Occasionally has pain from the hips to the ankles all day, causing her to visibly limp.  Her job requires walking and standing all day-walking up to 12 miles. 6.5 miles at the least. Elevating her legs lying on the couch helps, but she rarely gets to do that. Hydrocodone isn't helpful. We started gabapentin in 12/2016 for this. The only difference she's noticed is a weight gain of about 7 lbs.. No saddle anesthesia. No loss of bowel or bladder control  Chantix wasn't working at all, so she stopped it. Has cut back more on smoking, less than 1/2 ppd.  Watery eyes. No other eye/visual changes. Needs new eye specialist.  NCCSRS reviewed. All prescriptions known to me.  Review of Systems As above. No CP, SOB, HA, dizziness, N/V/diarrhea.    Patient Active Problem List   Diagnosis Date Noted  . Tobacco abuse 07/02/2016  . Chronic prescription opiate use 07/02/2016  . Hyperplastic colon polyp 03/09/2014  . HTN (hypertension) 12/03/2012  . Hyperlipidemia 12/03/2012  . Mild carotid artery disease (HCC)  12/03/2012  . DDD (degenerative disc disease), lumbosacral 12/03/2012  . Depression 12/03/2012  . HA (headache) 12/03/2012     Prior to Admission medications   Medication Sig Start Date End Date Taking? Authorizing Provider  aspirin 81 MG tablet Take 81 mg by mouth daily.   Yes [provider]  cyclobenzaprine (FLEXERIL) 10 MG tablet Take 1 tablet (10 mg total) by mouth at bedtime as needed for muscle spasms. 10/28/16  Yes Cal Gindlesperger, [name]  gabapentin (NEURONTIN) 300 MG capsule Take 1 capsule (300 mg total) by mouth 3 (three) times daily. 12/23/16  Yes Olivene Cookston, [name]  HYDROcodone-acetaminophen (NORCO) 10-325 MG tablet Take 1 tablet by mouth every 6 (six) hours as needed (pain). May fill 60 days after date on prescription 12/23/16  Yes Nonie Lochner, [name]  HYDROcodone-acetaminophen (NORCO) 10-325 MG tablet Take 1 tablet by mouth every 6 (six) hours as needed (pain). May fill 30 days after date on prescription 12/23/16  Yes Elver Stadler, [name]  HYDROcodone-acetaminophen (NORCO) 10-325 MG tablet TAKE ONE TABLET BY MOUTH EVERY 6 HOURS AS NEEDED FOR PAIN. 12/23/16  Yes Zailyn Rowser, [name]  hydrOXYzine (ATARAX/VISTARIL) 25 MG tablet Take 0.5-2 tablets (12.5-50 mg total) by mouth at bedtime as needed for anxiety or itching (insomnia). 07/02/16  Yes Francisca Harbuck, [name]  metoprolol succinate (TOPROL-XL) 25 MG 24 hr tablet Take 1 tablet (25 mg total) by mouth daily. 07/02/16  Yes Talvin Christianson, [name]  pravastatin (  PRAVACHOL) 40 MG tablet Take 1 tablet (40 mg total) by mouth daily. 12/23/16  Yes Claudette Wermuth, [name]  buPROPion (WELLBUTRIN XL) 150 MG 24 hr tablet Take 3 tablets (450 mg total) by mouth daily. 12/23/16 03/23/17  Porfirio OarJeffery, Bennet Kujawa, [name]     No Known Allergies     Objective:  Physical Exam  Constitutional: She is oriented to person, place, and time. She appears well-developed and well-nourished. She is active and cooperative. No distress.  BP 114/72 (BP Location: Right Arm,  Patient Position: Sitting, Cuff Size: Normal)   Pulse 73   Temp 98.7 F (37.1 C) (Oral)   Resp 18   Ht 5\' 1"  (1.549 m)   Wt 142 lb 6.4 oz (64.6 kg)   SpO2 98%   BMI 26.91 kg/m   HENT:  Head: Normocephalic and atraumatic.  Right Ear: Hearing normal.  Left Ear: Hearing normal.  Eyes: Conjunctivae are normal. No scleral icterus.  Neck: Normal range of motion. Neck supple. No thyromegaly present.  Cardiovascular: Normal rate, regular rhythm and normal heart sounds.   Pulses:      Radial pulses are 2+ on the right side, and 2+ on the left side.  Pulmonary/Chest: Effort normal and breath sounds normal.  Lymphadenopathy:       Head (right side): No tonsillar, no preauricular, no posterior auricular and no occipital adenopathy present.       Head (left side): No tonsillar, no preauricular, no posterior auricular and no occipital adenopathy present.    She has no cervical adenopathy.       Right: No supraclavicular adenopathy present.       Left: No supraclavicular adenopathy present.  Neurological: She is alert and oriented to person, place, and time. She has normal strength. No sensory deficit.  Reflex Scores:      Patellar reflexes are 2+ on the right side and 2+ on the left side.      Achilles reflexes are 2+ on the right side and 2+ on the left side. Skin: Skin is warm, dry and intact. No rash noted. No cyanosis or erythema. Nails show no clubbing.  Psychiatric: She has a normal mood and affect. Her speech is normal and behavior is normal.           Assessment & Plan:   Problem List Items Addressed This Visit    HTN (hypertension) - Primary (Chronic)    Well controlled. No changes.      DDD (degenerative disc disease), lumbosacral (Chronic)    Worsening, with radicular symptoms. Update MRI. Refer to ortho vs neurosurgery pending results.      Relevant Medications   HYDROcodone-acetaminophen (NORCO) 10-325 MG tablet   HYDROcodone-acetaminophen (NORCO) 10-325 MG tablet     HYDROcodone-acetaminophen (NORCO) 10-325 MG tablet   pregabalin (LYRICA) 75 MG capsule   Other Relevant Orders   MR Lumbar Spine Wo Contrast   Tobacco abuse    Chantix was ineffective. Continue working to cut back.      Chronic prescription opiate use    NCCSRS reviewed. No concerns identified. Stop gabapentin due to weight gain and lack of effectiveness (though low dose). Try pregabalin instead. Continue hydrocodone as needed.          Return in about 3 months (around 07/11/2017) for re-evaluation of mood, cholesterol, pain, etc. Will need annual UDS at that time.  Fernande Brashelle S. Atonya Templer, [name] [facility] Care at [facility]

## 2017-04-10 NOTE — Patient Instructions (Addendum)
Let me know how the Lyrica (pregabalin) is working in about a month. We can adjust the dose if needed.  [facility]  [address], [place], [place] [address]  Phone: [phone]  [facility] [address], [place], [place] [address]  Phone: [phone]  [facility] [address], [place], [place] [address]  Phone: [phone]    IF you received an x-ray today, you will receive an invoice from [facility]. Please contact [facility] at [phone] with questions or concerns regarding your invoice.   IF you received labwork today, you will receive an invoice from [facility]. Please contact [facility] at [phone] with questions or concerns regarding your invoice.   Our billing staff will not be able to assist you with questions regarding bills from these companies.  You will be contacted with the lab results as soon as they are available. The fastest way to get your results is to activate your My Chart account. Instructions are located on the last page of this paperwork. If you have not heard from us regarding the results in 2 weeks, please contact this office.     Did you know that you begin to benefit from quitting smoking within the first twenty minutes? It's TRUE.  At 20 minutes: -blood pressure decreases -pulse rate drops -body temperature of hands and feet increases  At 8 hours: -carbon monoxide level in blood drops to normal -oxygen level in blood increases to normal  At 24 hours: -the chance of heart attack decreases  At 48 hours: -nerve endings start regrowing -ability to smell and taste is enhanced  2 weeks-3 months: -circulation improves -walking becomes easier -lung function improves  1-9 months: -coughing, sinus congestion, fatigue and shortness of breath decreases  1 year: -excess risk of heart disease is decreased to HALF that of a smoker  5 years: Stroke risk is reduced to that of people who have never smoked  10  years: -risk of lung cancer drops to as little as half that of continuing smokers -risk of cancer of the mouth, throat, esophagus, bladder, kidney and pancreas decreases -risk of ulcer decreases  15 years -risk of heart disease is now similar to that of people who have never smoked -risk of death returns to nearly the level of people who have never smoked

## 2017-04-11 NOTE — Telephone Encounter (Signed)
Pt called to report that pregabalin (LYRICA) 75 MG capsule [[id number]][[id number]]  Is not covered by insurance. Please write for lower cost alternative

## 2017-04-11 NOTE — Telephone Encounter (Signed)
Please advise 

## 2017-04-11 NOTE — Telephone Encounter (Signed)
Is Lyrica (pregabalin) covered at any dose?  If not, she can go back to the gabapentin or try the duloxetine (Cymbalta).

## 2017-04-13 NOTE — Assessment & Plan Note (Signed)
Well controlled. No changes. 

## 2017-04-13 NOTE — Assessment & Plan Note (Signed)
Worsening, with radicular symptoms. Update MRI. Refer to ortho vs neurosurgery pending results.

## 2017-04-13 NOTE — Assessment & Plan Note (Signed)
Chantix was ineffective. Continue working to cut back.

## 2017-04-13 NOTE — Assessment & Plan Note (Signed)
NCCSRS reviewed. No concerns identified. Stop gabapentin due to weight gain and lack of effectiveness (though low dose). Try pregabalin instead. Continue hydrocodone as needed.

## 2017-04-14 NOTE — Telephone Encounter (Signed)
Left message medication dosage was substituted for 150 mg capsule once a day.

## 2017-04-14 NOTE — Telephone Encounter (Signed)
Lyrica 75 mg capsule BID substituted for 150 mg once a day. #30, 2 refills

## 2017-04-14 NOTE — Telephone Encounter (Signed)
[name] pt wants to know on Gabapentin what's the dosage she need to be on and she may need a refill on Gabapentin if dosage gets changed

## 2017-05-05 ENCOUNTER — Ambulatory Visit (HOSPITAL_COMMUNITY)
Admission: RE | Admit: 2017-05-05 | Discharge: 2017-05-05 | Disposition: A | Discharge status: Home / Self Care | Payer: 59 | Source: Ambulatory Visit | Attending: Physician Assistant | Admitting: Physician Assistant

## 2017-05-05 DIAGNOSIS — M5137 Other intervertebral disc degeneration, lumbosacral region: Secondary | ICD-10-CM | POA: Diagnosis present

## 2017-05-05 DIAGNOSIS — M48061 Spinal stenosis, lumbar region without neurogenic claudication: Secondary | ICD-10-CM | POA: Insufficient documentation

## 2017-05-08 ENCOUNTER — Other Ambulatory Visit: Payer: Self-pay | Admitting: Emergency Medicine

## 2017-05-08 ENCOUNTER — Telehealth: Payer: Self-pay | Admitting: Physician Assistant

## 2017-05-08 DIAGNOSIS — M5137 Other intervertebral disc degeneration, lumbosacral region: Secondary | ICD-10-CM

## 2017-05-08 MED ORDER — GABAPENTIN 100 MG PO CAPS: 100.0000 mg | ORAL_CAPSULE | Freq: Three times a day (TID) | ORAL | 3 refills | Status: DC

## 2017-05-08 MED ORDER — PREGABALIN 150 MG PO CAPS: 150.0000 mg | ORAL_CAPSULE | Freq: Every day | ORAL | 0 refills | Status: DC

## 2017-05-08 NOTE — Telephone Encounter (Signed)
Phone message from 7/30 shows pt medication Gabapentin dosage was change but pt refill was not filled.  Due to when the message was put in I marked high priority.  Please adv.  Also pt was calling for MRI results.

## 2017-05-08 NOTE — Telephone Encounter (Signed)
Attempted to reach patient on both contact numbers; vm not set up.

## 2017-05-08 NOTE — Telephone Encounter (Signed)
Gabapentin medication changed to 150 mg tablet daily #90, no refills

## 2017-05-09 ENCOUNTER — Other Ambulatory Visit: Payer: Self-pay | Admitting: Emergency Medicine

## 2017-05-09 NOTE — Progress Notes (Unsigned)
Pt called in Lyrica medication os 300 dollars. New script of Gabapentin 100 mg tab TID sent to pharmacy.

## 2017-05-12 NOTE — Telephone Encounter (Signed)
Spoke with patient, medication sent to pharmacy 

## 2017-05-13 ENCOUNTER — Other Ambulatory Visit: Payer: Self-pay | Admitting: Physician Assistant

## 2017-05-13 DIAGNOSIS — M5116 Intervertebral disc disorders with radiculopathy, lumbar region: Secondary | ICD-10-CM

## 2017-07-01 ENCOUNTER — Telehealth: Payer: Self-pay | Admitting: Physician Assistant

## 2017-07-01 NOTE — Telephone Encounter (Signed)
Pt is calling to get a refill of her flexeril.  Please advise  [phone]

## 2017-07-02 ENCOUNTER — Other Ambulatory Visit: Payer: Self-pay

## 2017-07-02 DIAGNOSIS — M5137 Other intervertebral disc degeneration, lumbosacral region: Secondary | ICD-10-CM

## 2017-07-02 MED ORDER — CYCLOBENZAPRINE HCL 10 MG PO TABS: 10.0000 mg | ORAL_TABLET | Freq: Every evening | ORAL | 0 refills | Status: DC | PRN

## 2017-07-02 NOTE — Telephone Encounter (Signed)
Refilled

## 2017-07-05 ENCOUNTER — Ambulatory Visit (INDEPENDENT_AMBULATORY_CARE_PROVIDER_SITE_OTHER): Payer: 59 | Admitting: Physician Assistant

## 2017-07-05 ENCOUNTER — Encounter: Payer: Self-pay | Admitting: Physician Assistant

## 2017-07-05 ENCOUNTER — Ambulatory Visit (INDEPENDENT_AMBULATORY_CARE_PROVIDER_SITE_OTHER): Payer: 59

## 2017-07-05 VITALS — BP 128/86 | HR 86 | Temp 98.4°F | Resp 18 | Ht 61.0 in | Wt 139.4 lb

## 2017-07-05 DIAGNOSIS — M25561 Pain in right knee: Secondary | ICD-10-CM

## 2017-07-05 NOTE — Progress Notes (Addendum)
07/05/2017 10:27 AM   DOB: [birth year] / MRN: [id number]  SUBJECTIVE:  Jamie Walters is a 56 y.o. female presenting for knee pain, two weeks, stabbing in nature.  No known injury.  Getting worse. No history of gout.  No bruising.  She takeds 81 mg asa daily for prophylaxis. Pain wakes her up at night.  Gets worse with walking. 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    She has No Known Allergies.   She  has a past medical history of Allergy; Arthritis; Depression; and Hypertension.    She  reports that she has been smoking Cigarettes.  She has a 10.00 pack-year smoking history. She has never used smokeless tobacco. She reports that she does not drink alcohol or use drugs. She  reports that she currently engages in sexual activity and has had female partners. She reports using the following method of birth control/protection: Post-menopausal. The patient  has a past surgical history that includes Tubal ligation.  Her family history includes Alcohol abuse in her father; Arthritis in her father; COPD in her brother, brother, and father; Cancer in her father; Hyperlipidemia in her mother; Hypertension in her mother and sister; Mental illness in her son; Stroke in her mother.  Review of Systems  Constitutional: Negative for chills, diaphoresis and fever.  Gastrointestinal: Negative for nausea.  Musculoskeletal: Positive for joint pain.  Skin: Negative for rash.  Neurological: Negative for dizziness.    The problem list and medications were reviewed and updated by myself where necessary and exist elsewhere in the encounter.   OBJECTIVE:  BP 128/86 (BP Location: Left Arm, Patient Position: Sitting, Cuff Size: Normal)   Pulse 86   Temp 98.4 F (36.9 C) (Oral)   Resp 18   Ht 5\' 1"  (1.549 m)   Wt 139 lb 6.4 oz (63.2 kg)   SpO2 97%   BMI 26.34 kg/m   Physical Exam  Constitutional: She is active.  Cardiovascular: Normal rate.   Pulmonary/Chest: Effort normal. No tachypnea.    Musculoskeletal: She exhibits tenderness (medial compartment of the knee, made exquisitley worse when varus stress applied to the joint. ).  Neurological: She is alert.  Skin: Skin is warm.    No results found for this or any previous visit (from the past 72 hour(s)).  Dg Knee Complete 4 Views Right  Result Date: 07/05/2017 CLINICAL DATA:  Acute knee pain, initial encounter EXAM: RIGHT KNEE - COMPLETE 4+ VIEW COMPARISON:  None. FINDINGS: No evidence of fracture, dislocation, or joint effusion. No evidence of arthropathy or other focal bone abnormality. Soft tissues are unremarkable. IMPRESSION: No acute abnormality noted. Electronically Signed   By: Alcide CleverMark  Lukens M.D.   On: 07/05/2017 10:00   Procedure: Risk and benefits discussed and verbal consent obtained.  Betadine prep of the right knee times three. Skin overlying the lateral suprapatellar bursa anesthetized using 1:1 mix of 2% lidocaine without and bupivacaine.  Suprapatellar bursa accessed with a 20 gauge needle using a lateral approach.  1:3 mixture of Triamcinolone and bupivacaine injected into the bursae.  There was minimal to no resistance to pushing in the medication. Needle extracted and bandage placed.  Patient with immediate relief of pain with ambulation.    ASSESSMENT AND PLAN:  Jamie Walters was seen today for knee pain and medication refill.  Diagnoses and all orders for this visit:  Acute pain of right knee: She has OA in the knee per exam and rads.  The bursal injection took her pain  away immediately.  Advised the pain will return but she should get some relief within the next few days as the steroid begins to take effect.  I hope it will last for at least a month.  -     DG Knee Complete 4 Views Right; Future  Tenderness of right knee: Injected.     The patient is advised to call or return to clinic if she does not see an improvement in symptoms, or to seek the care of the closest emergency department if she worsens with  the above plan.   [name], MHS, [name] Primary Care at [facility] 07/05/2017 10:27 AM

## 2017-07-05 NOTE — Patient Instructions (Addendum)
Come back if any fever, chills, worseing knee pain.     IF you received an x-ray today, you will receive an invoice from [facility]. Please contact [facility] at [phone] with questions or concerns regarding your invoice.   IF you received labwork today, you will receive an invoice from [facility]. Please contact [facility] at [phone] with questions or concerns regarding your invoice.   Our billing staff will not be able to assist you with questions regarding bills from these companies.  You will be contacted with the lab results as soon as they are available. The fastest way to get your results is to activate your My Chart account. Instructions are located on the last page of this paperwork. If you have not heard from us regarding the results in 2 weeks, please contact this office.

## 2017-07-08 ENCOUNTER — Telehealth: Payer: Self-pay | Admitting: Family Medicine

## 2017-07-08 DIAGNOSIS — M5137 Other intervertebral disc degeneration, lumbosacral region: Secondary | ICD-10-CM

## 2017-07-08 MED ORDER — HYDROCODONE-ACETAMINOPHEN 10-325 MG PO TABS
ORAL_TABLET | ORAL | 0 refills | Status: DC
Start: 2017-07-08 — End: 2017-09-24

## 2017-07-08 MED ORDER — HYDROCODONE-ACETAMINOPHEN 10-325 MG PO TABS: 1.0000 | ORAL_TABLET | Freq: Four times a day (QID) | ORAL | 0 refills | Status: DC | PRN

## 2017-07-08 NOTE — Telephone Encounter (Signed)
Meds ordered this encounter  Medications  . HYDROcodone-acetaminophen (NORCO) 10-325 MG tablet    Sig: TAKE ONE TABLET BY MOUTH EVERY 6 HOURS AS NEEDED FOR PAIN.    Dispense:  120 tablet    Refill:  0    Order Specific Question:   Supervising Provider    Answer:   SHAW, EVA N [[id number]]  . HYDROcodone-acetaminophen (NORCO) 10-325 MG tablet    Sig: Take 1 tablet by mouth every 6 (six) hours as needed (pain). May fill 60 days after date on prescription    Dispense:  120 tablet    Refill:  0    Order Specific Question:   Supervising Provider    Answer:   Clelia CroftSHAW, EVA N [[id number]]  . HYDROcodone-acetaminophen (NORCO) 10-325 MG tablet    Sig: Take 1 tablet by mouth every 6 (six) hours as needed (pain). May fill 30 days after date on prescription    Dispense:  120 tablet    Refill:  0    Order Specific Question:   Supervising Provider    Answer:   Clelia CroftSHAW, EVA N [[id number]]

## 2017-07-08 NOTE — Telephone Encounter (Signed)
Please advise 

## 2017-07-09 ENCOUNTER — Other Ambulatory Visit: Payer: Self-pay | Admitting: Physician Assistant

## 2017-07-09 ENCOUNTER — Telehealth: Payer: Self-pay

## 2017-07-09 DIAGNOSIS — I10 Essential (primary) hypertension: Secondary | ICD-10-CM

## 2017-07-11 ENCOUNTER — Ambulatory Visit: Payer: PRIVATE HEALTH INSURANCE | Admitting: Physician Assistant

## 2017-07-17 ENCOUNTER — Other Ambulatory Visit: Payer: Self-pay | Admitting: Physician Assistant

## 2017-07-17 DIAGNOSIS — I10 Essential (primary) hypertension: Secondary | ICD-10-CM

## 2017-07-19 ENCOUNTER — Ambulatory Visit (INDEPENDENT_AMBULATORY_CARE_PROVIDER_SITE_OTHER): Payer: 59 | Admitting: Physician Assistant

## 2017-07-19 ENCOUNTER — Encounter: Payer: Self-pay | Admitting: Physician Assistant

## 2017-07-19 VITALS — BP 138/82 | HR 86 | Temp 98.3°F | Resp 18 | Ht 61.0 in | Wt 140.4 lb

## 2017-07-19 DIAGNOSIS — M5116 Intervertebral disc disorders with radiculopathy, lumbar region: Secondary | ICD-10-CM | POA: Diagnosis not present

## 2017-07-19 DIAGNOSIS — F329 Major depressive disorder, single episode, unspecified: Secondary | ICD-10-CM

## 2017-07-19 DIAGNOSIS — Z720 Tobacco use: Secondary | ICD-10-CM | POA: Diagnosis not present

## 2017-07-19 DIAGNOSIS — I739 Peripheral vascular disease, unspecified: Secondary | ICD-10-CM

## 2017-07-19 DIAGNOSIS — M25561 Pain in right knee: Secondary | ICD-10-CM | POA: Insufficient documentation

## 2017-07-19 DIAGNOSIS — I779 Disorder of arteries and arterioles, unspecified: Secondary | ICD-10-CM | POA: Diagnosis not present

## 2017-07-19 DIAGNOSIS — Z79891 Long term (current) use of opiate analgesic: Secondary | ICD-10-CM | POA: Diagnosis not present

## 2017-07-19 DIAGNOSIS — R008 Other abnormalities of heart beat: Secondary | ICD-10-CM | POA: Diagnosis not present

## 2017-07-19 DIAGNOSIS — F32A Depression, unspecified: Secondary | ICD-10-CM

## 2017-07-19 DIAGNOSIS — I10 Essential (primary) hypertension: Secondary | ICD-10-CM

## 2017-07-19 DIAGNOSIS — E785 Hyperlipidemia, unspecified: Secondary | ICD-10-CM

## 2017-07-19 MED ORDER — METOPROLOL SUCCINATE ER 25 MG PO TB24: 25.0000 mg | ORAL_TABLET | Freq: Every day | ORAL | 3 refills | Status: DC

## 2017-07-19 MED ORDER — GABAPENTIN 100 MG PO CAPS: 100.0000 mg | ORAL_CAPSULE | Freq: Three times a day (TID) | ORAL | 3 refills | Status: DC

## 2017-07-19 NOTE — Assessment & Plan Note (Signed)
Stable. No changes. Given new finding of gallop on auscultation, cardiology may recommend additional agents once cause identified.

## 2017-07-19 NOTE — Assessment & Plan Note (Signed)
Encouraged smoking cessation. Stress at work is her primary barrier.

## 2017-07-19 NOTE — Assessment & Plan Note (Signed)
Radiographs from last month reviewed. Radiology reading is normal. Some minimal medial joint space narrowing noted on my reading. Point of maximum tenderness is over the anserine bursa, but she also has tenderness along the medial joint line. Refer to orthopedics.

## 2017-07-19 NOTE — Assessment & Plan Note (Signed)
Refer to cardiology for additional evaluation and treatment.

## 2017-07-19 NOTE — Patient Instructions (Signed)
     IF you received an x-ray today, you will receive an invoice from [facility]. Please contact [facility] at [phone] with questions or concerns regarding your invoice.   IF you received labwork today, you will receive an invoice from [facility]. Please contact [facility] at [phone] with questions or concerns regarding your invoice.   Our billing staff will not be able to assist you with questions regarding bills from these companies.  You will be contacted with the lab results as soon as they are available. The fastest way to get your results is to activate your My Chart account. Instructions are located on the last page of this paperwork. If you have not heard from us regarding the results in 2 weeks, please contact this office.     

## 2017-07-19 NOTE — Assessment & Plan Note (Signed)
COntinue efforts for smoking cessation and HTN and lipid control.

## 2017-07-19 NOTE — Assessment & Plan Note (Signed)
Stable. Controlled. 

## 2017-07-19 NOTE — Progress Notes (Signed)
Patient ID: Jamie Walters, female    DOB: September 10, 1961, 56 y.o.   MRN: [id number]  PCP: Porfirio Oar, [name]  Chief Complaint  Patient presents with  . Hyperlipidemia  . Hypertension  . Pain    pt states her right knee is still bothering her.  . Mood  . Follow-up    Subjective:   Presents for evaluation of HTN, hyperlipidemia, chronic back pain and RIGHT knee pain.  She is tolerating her medications without difficulty. Finds the lower dose of gabapentin remains effective, and she has less appetite (the higher dose made her ravenous).  Sees Dr. Venetia Maxon 08/13/2017 to address her significant back issues.  Labs done at work in 05/2017. Forgot to bring report, so will send it to me. "Passed" the wellness criteria except for BP.  She saw one of my colleagues on 10/20 with RIGHT medial knee pain. She has altered her gait due to pain with ambulation, which is aggravating her back pain. She received a steroid injection, and had immediate relief of the knee pain, but once the local anesthetic wore off, the pain recurred, and she found no benefit from the steroid component.  Review of Systems As above.    Patient Active Problem List   Diagnosis Date Noted  . Tobacco abuse 07/02/2016  . Chronic prescription opiate use 07/02/2016  . Hyperplastic colon polyp 03/09/2014  . HTN (hypertension) 12/03/2012  . Hyperlipidemia 12/03/2012  . Mild carotid artery disease (HCC) 12/03/2012  . Lumbar disc disease with radiculopathy 12/03/2012  . Depression 12/03/2012  . HA (headache) 12/03/2012     Prior to Admission medications   Medication Sig Start Date End Date Taking? Authorizing Provider  aspirin 81 MG tablet Take 81 mg by mouth daily.   Yes [provider]  cyclobenzaprine (FLEXERIL) 10 MG tablet Take 1 tablet (10 mg total) by mouth at bedtime as needed for muscle spasms. 07/02/17  Yes Baptiste Littler, [name]  gabapentin (NEURONTIN) 100 MG capsule Take 100 mg by mouth 3  (three) times daily.   Yes [provider]  HYDROcodone-acetaminophen (NORCO) 10-325 MG tablet TAKE ONE TABLET BY MOUTH EVERY 6 HOURS AS NEEDED FOR PAIN. 07/08/17  Yes Delyle Weider, [name]  HYDROcodone-acetaminophen (NORCO) 10-325 MG tablet Take 1 tablet by mouth every 6 (six) hours as needed (pain). Walters fill 60 days after date on prescription 07/08/17  Yes Isma Tietje, [name]  HYDROcodone-acetaminophen (NORCO) 10-325 MG tablet Take 1 tablet by mouth every 6 (six) hours as needed (pain). Walters fill 30 days after date on prescription 07/08/17  Yes Rosemond Lyttle, [name]  hydrOXYzine (ATARAX/VISTARIL) 25 MG tablet Take 0.5-2 tablets (12.5-50 mg total) by mouth at bedtime as needed for anxiety or itching (insomnia). 07/02/16  Yes Treasa Bradshaw, [name]  metoprolol succinate (TOPROL-XL) 25 MG 24 hr tablet TAKE 1 TABLET BY MOUTH DAILY 07/10/17  Yes Dakari Stabler, [name]  pravastatin (PRAVACHOL) 40 MG tablet Take 1 tablet (40 mg total) by mouth daily. 12/23/16  Yes Zell Doucette, [name]  buPROPion (WELLBUTRIN XL) 150 MG 24 hr tablet Take 3 tablets (450 mg total) by mouth daily. 12/23/16 03/23/17  Porfirio Oar, [name]     No Known Allergies     Objective:  Physical Exam  Constitutional: She is oriented to person, place, and time. She appears well-developed and well-nourished. She is active and cooperative. No distress.  BP 138/82 (BP Location: Left Arm, Patient Position: Sitting, Cuff Size: Normal)   Pulse 86   Temp 98.3 F (36.8  C) (Oral)   Resp 18   Ht 5\' 1"  (1.549 m)   Wt 140 lb 6.4 oz (63.7 kg)   SpO2 99%   BMI 26.53 kg/m   HENT:  Head: Normocephalic and atraumatic.  Right Ear: Hearing normal.  Left Ear: Hearing normal.  Eyes: Conjunctivae are normal. No scleral icterus.  Neck: Normal range of motion. Neck supple. No thyromegaly present.  Cardiovascular: Normal rate and regular rhythm.  Exam reveals gallop.   Pulses:      Radial pulses are 2+ on the right side, and 2+ on the left  side.  Pulmonary/Chest: Effort normal and breath sounds normal.  Musculoskeletal:       Right knee: She exhibits normal range of motion, no swelling, no effusion, no ecchymosis, no deformity, no laceration, no erythema, normal alignment, no LCL laxity, normal patellar mobility, normal meniscus and no MCL laxity. Tenderness found. Medial joint line tenderness noted. No lateral joint line, no MCL, no LCL and no patellar tendon tenderness noted.       Legs: Lymphadenopathy:       Head (right side): No tonsillar, no preauricular, no posterior auricular and no occipital adenopathy present.       Head (left side): No tonsillar, no preauricular, no posterior auricular and no occipital adenopathy present.    She has no cervical adenopathy.       Right: No supraclavicular adenopathy present.       Left: No supraclavicular adenopathy present.  Neurological: She is alert and oriented to person, place, and time. No sensory deficit.  Skin: Skin is warm, dry and intact. No rash noted. No cyanosis or erythema. Nails show no clubbing.  Psychiatric: She has a normal mood and affect. Her speech is normal and behavior is normal.        Assessment & Plan:   Problem List Items Addressed This Visit    HTN (hypertension) - Primary (Chronic)    Stable. No changes. Given new finding of gallop on auscultation, cardiology Walters recommend additional agents once cause identified.      Relevant Medications   metoprolol succinate (TOPROL-XL) 25 MG 24 hr tablet   Hyperlipidemia (Chronic)    Reportedly normal 05/2017 at employer health screening. She will provide me with a copy of the report. Adjust pravastatin dose if indicated.      Relevant Medications   metoprolol succinate (TOPROL-XL) 25 MG 24 hr tablet   Mild carotid artery disease (HCC) (Chronic)    COntinue efforts for smoking cessation and HTN and lipid control.      Relevant Medications   metoprolol succinate (TOPROL-XL) 25 MG 24 hr tablet   Depression  (Chronic)    Stable. Controlled.      Lumbar disc disease with radiculopathy    Proceed with evaluation with neurosurgery later this month.      Relevant Medications   gabapentin (NEURONTIN) 100 MG capsule   Tobacco abuse    Encouraged smoking cessation. Stress at work is her primary barrier.      Chronic prescription opiate use    Update UDS today. Hope that definitive treatment of lumbar degenerative disease will allow for reduction/elimination of regular opiate use.      Relevant Orders   ToxASSURE Select 13 (MW), Urine   Right medial knee pain    Radiographs from last month reviewed. Radiology reading is normal. Some minimal medial joint space narrowing noted on my reading. Point of maximum tenderness is over the anserine bursa, but she also has  tenderness along the medial joint line. Refer to orthopedics.      Relevant Orders   Ambulatory referral to Orthopedic Surgery   Gallop rhythm    Refer to cardiology for additional evaluation and treatment.      Relevant Orders   Ambulatory referral to Cardiology       Return in about 3 months (around 10/19/2017) for re-evaluation of blood pressure, pain, depression.   Fernande Bras, [name] Primary Care at [facility]

## 2017-07-19 NOTE — Assessment & Plan Note (Signed)
Update UDS today. Hope that definitive treatment of lumbar degenerative disease will allow for reduction/elimination of regular opiate use.

## 2017-07-19 NOTE — Assessment & Plan Note (Signed)
Proceed with evaluation with neurosurgery later this month.

## 2017-07-19 NOTE — Assessment & Plan Note (Signed)
Reportedly normal 05/2017 at employer health screening. She will provide me with a copy of the report. Adjust pravastatin dose if indicated.

## 2017-07-26 LAB — TOXASSURE SELECT 13 (MW), URINE

## 2017-08-29 ENCOUNTER — Telehealth: Payer: Self-pay | Admitting: Physician Assistant

## 2017-08-29 DIAGNOSIS — M5137 Other intervertebral disc degeneration, lumbosacral region: Secondary | ICD-10-CM

## 2017-08-29 NOTE — Telephone Encounter (Signed)
Copied from CRM [id number]. Topic: Quick Communication - See Telephone Encounter >> Aug 29, 2017  1:53 PM Elliot GaultBell, Tiffany M wrote: CRM for notification. See Telephone encounter for:   08/29/17 Relation to pt: self Call back number: [phone] Pharmacy: [id number] - [place], [place] - [address] [phone] (Phone) [phone] (Fax)   Reason for call:  Patient requesting cyclobenzaprine (FLEXERIL) 10 MG tablet refill, informed patient in the future please contact pharmacy for any medication refill request and please allow 72 hour turn around time, patient voice understanding,please advise

## 2017-09-01 MED ORDER — CYCLOBENZAPRINE HCL 10 MG PO TABS: 10.0000 mg | ORAL_TABLET | Freq: Every evening | ORAL | 0 refills | Status: DC | PRN

## 2017-09-01 NOTE — Telephone Encounter (Signed)
Meds ordered this encounter  Medications  . cyclobenzaprine (FLEXERIL) 10 MG tablet    Sig: Take 1 tablet (10 mg total) by mouth at bedtime as needed for muscle spasms.    Dispense:  30 tablet    Refill:  0    Order Specific Question:   Supervising Provider    Answer:   Clelia CroftSHAW, EVA N [[id number]]

## 2017-09-03 NOTE — Telephone Encounter (Signed)
Error-Sign Encounter 

## 2017-09-23 ENCOUNTER — Telehealth: Payer: Self-pay | Admitting: Physician Assistant

## 2017-09-23 NOTE — Telephone Encounter (Signed)
Tried to call pt to remind them of their appt tomorrow 09/24/17 °

## 2017-09-24 ENCOUNTER — Other Ambulatory Visit: Payer: Self-pay

## 2017-09-24 ENCOUNTER — Ambulatory Visit (INDEPENDENT_AMBULATORY_CARE_PROVIDER_SITE_OTHER): Payer: 59 | Admitting: Physician Assistant

## 2017-09-24 ENCOUNTER — Telehealth: Payer: Self-pay | Admitting: Physician Assistant

## 2017-09-24 ENCOUNTER — Encounter: Payer: Self-pay | Admitting: Physician Assistant

## 2017-09-24 VITALS — BP 100/66 | HR 88 | Temp 99.2°F | Resp 18 | Ht 61.0 in | Wt 144.2 lb

## 2017-09-24 DIAGNOSIS — R008 Other abnormalities of heart beat: Secondary | ICD-10-CM

## 2017-09-24 DIAGNOSIS — G2581 Restless legs syndrome: Secondary | ICD-10-CM

## 2017-09-24 DIAGNOSIS — G4700 Insomnia, unspecified: Secondary | ICD-10-CM | POA: Diagnosis not present

## 2017-09-24 DIAGNOSIS — E785 Hyperlipidemia, unspecified: Secondary | ICD-10-CM

## 2017-09-24 DIAGNOSIS — Z79891 Long term (current) use of opiate analgesic: Secondary | ICD-10-CM | POA: Diagnosis not present

## 2017-09-24 DIAGNOSIS — I10 Essential (primary) hypertension: Secondary | ICD-10-CM | POA: Diagnosis not present

## 2017-09-24 DIAGNOSIS — M5116 Intervertebral disc disorders with radiculopathy, lumbar region: Secondary | ICD-10-CM

## 2017-09-24 DIAGNOSIS — M5137 Other intervertebral disc degeneration, lumbosacral region: Secondary | ICD-10-CM

## 2017-09-24 DIAGNOSIS — L299 Pruritus, unspecified: Secondary | ICD-10-CM | POA: Diagnosis not present

## 2017-09-24 MED ORDER — HYDROCODONE-ACETAMINOPHEN 10-325 MG PO TABS: 1.0000 | ORAL_TABLET | Freq: Four times a day (QID) | ORAL | 0 refills | Status: DC | PRN

## 2017-09-24 MED ORDER — HYDROXYZINE HCL 25 MG PO TABS: 12.5000 mg | ORAL_TABLET | Freq: Every evening | ORAL | 3 refills | Status: AC | PRN

## 2017-09-24 MED ORDER — HYDROCODONE-ACETAMINOPHEN 10-325 MG PO TABS: ORAL_TABLET | ORAL | 0 refills | Status: DC

## 2017-09-24 MED ORDER — PRAMIPEXOLE DIHYDROCHLORIDE 0.125 MG PO TABS: ORAL_TABLET | ORAL | 0 refills | Status: DC

## 2017-09-24 MED ORDER — PREGABALIN 100 MG PO CAPS: 100.0000 mg | ORAL_CAPSULE | Freq: Two times a day (BID) | ORAL | 0 refills | Status: DC

## 2017-09-24 NOTE — Telephone Encounter (Signed)
Please advise 

## 2017-09-24 NOTE — Patient Instructions (Addendum)
Please call [facility] to schedule the appointment there: [phone].  Try the Lyrica (pregabalin) in place of the Neurontin (gabapentin). We're looking to see if it controls the pain, restless legs as well, but doesn't cause so much appetite. Let me know if it doesn't help-there are more alternatives.  Ask Dr. Venetia MaxonStern to send me a copy of his note when you see him in February.  Look for your most recent labs and send me a copy.  Contact your eye specialist about the watery eyes.    IF you received an x-ray today, you will receive an invoice from [facility]. Please contact [facility] at [phone] with questions or concerns regarding your invoice.   IF you received labwork today, you will receive an invoice from [facility]. Please contact [facility] at [phone] with questions or concerns regarding your invoice.   Our billing staff will not be able to assist you with questions regarding bills from these companies.  You will be contacted with the lab results as soon as they are available. The fastest way to get your results is to activate your My Chart account. Instructions are located on the last page of this paperwork. If you have not heard from us regarding the results in 2 weeks, please contact this office.

## 2017-09-24 NOTE — Progress Notes (Signed)
Patient ID: Jamie Walters, female    DOB: 03-Jun-1961, 57 y.o.   MRN: [id number]  PCP: Porfirio Oar, [name]  Chief Complaint  Patient presents with  . Medication Refill    NORCO, Hydroxyzine 25 MG, Flexeril 10 MG, Pravastatin 40 MG    Subjective:   Presents for evaluation of HTN, hyperlipidemia, chronic pain, restless leg syndrome.  Had an appointment with neurosurgery at the end of November. She really liked him. He explained things really well. He didn't think that surgery or injections would help. She was to go to pain management and then follow-up with him in February. Has elected not to pursue either modality, so has cancelled the Pain Management appointment. She will follow-up with Dr. Venetia Maxon as planned to discuss her decision.  Was referred to cardiology due to new gallop rhythm. No one called to schedule her. Referral note reviewed. They were unable to reach her, so closed the referral.  Appetite continues to be large. "I'm always eating." Frustrated that she has gained 5 more pounds. Reducing the gabapentin dose has helped. Never feels full. Stomach is always growling.Gabapentin has really helped the RLS symptoms. It did not help her back pain.    Review of Systems As above.    Patient Active Problem List   Diagnosis Date Noted  . Right medial knee pain 07/19/2017  . Gallop rhythm 07/19/2017  . Tobacco abuse 07/02/2016  . Chronic prescription opiate use 07/02/2016  . Hyperplastic colon polyp 03/09/2014  . HTN (hypertension) 12/03/2012  . Hyperlipidemia 12/03/2012  . Mild carotid artery disease (HCC) 12/03/2012  . Lumbar disc disease with radiculopathy 12/03/2012  . Depression 12/03/2012  . HA (headache) 12/03/2012     Prior to Admission medications   Medication Sig Start Date End Date Taking? Authorizing Provider  aspirin 81 MG tablet Take 81 mg by mouth daily.   Yes [provider]  cyclobenzaprine (FLEXERIL) 10 MG tablet Take 1 tablet (10 mg  total) by mouth at bedtime as needed for muscle spasms. 09/01/17  Yes Norma Ignasiak, [name]  gabapentin (NEURONTIN) 100 MG capsule Take 1 capsule (100 mg total) by mouth 3 (three) times daily. 07/19/17  Yes Britian Jentz, [name]  HYDROcodone-acetaminophen (NORCO) 10-325 MG tablet TAKE ONE TABLET BY MOUTH EVERY 6 HOURS AS NEEDED FOR PAIN. 07/08/17  Yes Lindyn Vossler, [name]  HYDROcodone-acetaminophen (NORCO) 10-325 MG tablet Take 1 tablet by mouth every 6 (six) hours as needed (pain). Walters fill 60 days after date on prescription 07/08/17  Yes Jerid Catherman, [name]  HYDROcodone-acetaminophen (NORCO) 10-325 MG tablet Take 1 tablet by mouth every 6 (six) hours as needed (pain). Walters fill 30 days after date on prescription 07/08/17  Yes Chiquitta Matty, [name]  hydrOXYzine (ATARAX/VISTARIL) 25 MG tablet Take 0.5-2 tablets (12.5-50 mg total) by mouth at bedtime as needed for anxiety or itching (insomnia). 07/02/16  Yes Clarisa Danser, [name]  metoprolol succinate (TOPROL-XL) 25 MG 24 hr tablet Take 1 tablet (25 mg total) by mouth daily. 07/19/17  Yes Jery Hollern, [name]  pravastatin (PRAVACHOL) 40 MG tablet Take 1 tablet (40 mg total) by mouth daily. 12/23/16  Yes Emerson Schreifels, [name]  buPROPion (WELLBUTRIN XL) 150 MG 24 hr tablet Take 3 tablets (450 mg total) by mouth daily. 12/23/16 03/23/17  Porfirio Oar, [name]     No Known Allergies     Objective:  Physical Exam  Constitutional: She is oriented to person, place, and time. She appears well-developed and well-nourished. She is active and cooperative.  No distress.  BP 100/66 (BP Location: Left Arm, Patient Position: Sitting, Cuff Size: Normal)   Pulse 88   Temp 99.2 F (37.3 C) (Oral)   Resp 18   Ht 5\' 1"  (1.549 m)   Wt 144 lb 3.2 oz (65.4 kg)   SpO2 97%   BMI 27.25 kg/m   HENT:  Head: Normocephalic and atraumatic.  Right Ear: Hearing normal.  Left Ear: Hearing normal.  Eyes: Conjunctivae are normal. No scleral icterus.  Neck: Normal range of  motion. Neck supple. No thyromegaly present.  Cardiovascular: Normal rate and regular rhythm. Exam reveals gallop.  Pulses:      Radial pulses are 2+ on the right side, and 2+ on the left side.  Pulmonary/Chest: Effort normal and breath sounds normal.  Lymphadenopathy:       Head (right side): No tonsillar, no preauricular, no posterior auricular and no occipital adenopathy present.       Head (left side): No tonsillar, no preauricular, no posterior auricular and no occipital adenopathy present.    She has no cervical adenopathy.       Right: No supraclavicular adenopathy present.       Left: No supraclavicular adenopathy present.  Neurological: She is alert and oriented to person, place, and time. No sensory deficit.  Skin: Skin is warm, dry and intact. No rash noted. No cyanosis or erythema. Nails show no clubbing.  Psychiatric: She has a normal mood and affect. Her speech is normal and behavior is normal.       Wt Readings from Last 3 Encounters:  09/24/17 144 lb 3.2 oz (65.4 kg)  07/19/17 140 lb 6.4 oz (63.7 kg)  07/05/17 139 lb 6.4 oz (63.2 kg)       Assessment & Plan:   Problem List Items Addressed This Visit    HTN (hypertension) - Primary (Chronic)    Well controlled. Continue current treatment.      Hyperlipidemia (Chronic)   Lumbar disc disease with radiculopathy    Proceed with follow-up with neurosurgery.      Relevant Medications   HYDROcodone-acetaminophen (NORCO) 10-325 MG tablet   HYDROcodone-acetaminophen (NORCO) 10-325 MG tablet   HYDROcodone-acetaminophen (NORCO) 10-325 MG tablet   hydrOXYzine (ATARAX/VISTARIL) 25 MG tablet   Chronic prescription opiate use   Gallop rhythm    Provided patient with the number for Heart Care so she can schedule evaluation there.      Restless legs syndrome (RLS)    STOP gabapentin, due to adverse effect of weight gain, though it controlled RLS well. Trial of Mirapex.       Other Visit Diagnoses    DDD  (degenerative disc disease), lumbosacral  (Chronic)      Relevant Medications   HYDROcodone-acetaminophen (NORCO) 10-325 MG tablet   HYDROcodone-acetaminophen (NORCO) 10-325 MG tablet   HYDROcodone-acetaminophen (NORCO) 10-325 MG tablet   Itching       Relevant Medications   hydrOXYzine (ATARAX/VISTARIL) 25 MG tablet   Insomnia, unspecified type       Relevant Medications   hydrOXYzine (ATARAX/VISTARIL) 25 MG tablet       Return in about 6 months (around 03/24/2018).   Fernande Brashelle S. Kekoa Fyock, [name] Primary Care at [facility]

## 2017-09-24 NOTE — Telephone Encounter (Signed)
Cancel Lyrica, due to cost.  Instead: (sig is too long, so you'll need to fax it) Meds ordered this encounter  Medications  . pramipexole (MIRAPEX) 0.125 MG tablet    Sig: Take 1 tablet (0.125 mg total) by mouth at bedtime for 7 days, THEN 2 tablets (0.25 mg total) every evening for 7 days, THEN 3 tablets (0.375 mg total) every evening for 7 days, THEN 4 tablets (0.5 mg total) every evening for 7 days.    Dispense:  70 tablet    Refill:  0    Order Specific Question:   Supervising Provider    Answer:   SHAW, EVA N [[id number]]   Take each dose 2-3 hours before bedtime. Increase weekly, up to 4 tablets, until symptoms are resolved. Let me know what dose works, and I'll send in a new prescription. If 4 tablets doesn't adequately resolve the symptoms, let me know, so we can increase further.

## 2017-09-24 NOTE — Telephone Encounter (Signed)
Copied from CRM [phone]. Topic: General - Other >> Sep 24, 2017  3:14 PM Cecelia ByarsGreen, Temeka L, RMA wrote: Reason for CRM: patient is calling to notify Theora Gianottihelle Jeffrey that medication Lyrica prescribed today is too expensive and she would like a more afford able medication prescribed please call patient

## 2017-09-26 ENCOUNTER — Telehealth: Payer: Self-pay

## 2017-09-26 NOTE — Telephone Encounter (Signed)
Faxed

## 2017-09-27 NOTE — Telephone Encounter (Signed)
Close Encounter 

## 2017-09-28 NOTE — Assessment & Plan Note (Signed)
STOP gabapentin, due to adverse effect of weight gain, though it controlled RLS well. Trial of Mirapex.

## 2017-09-28 NOTE — Assessment & Plan Note (Signed)
Proceed with follow-up with neurosurgery.

## 2017-09-28 NOTE — Assessment & Plan Note (Signed)
UDS updated last month. Results as expected. She does not plan to see pain management. Try pregabalin if she can afford it. Consider dyloxetine.

## 2017-09-28 NOTE — Assessment & Plan Note (Signed)
Provided patient with the number for [facility] so she can schedule evaluation there.

## 2017-09-28 NOTE — Assessment & Plan Note (Signed)
Well controlled. Continue current treatment. 

## 2017-10-08 ENCOUNTER — Telehealth: Payer: Self-pay | Admitting: Physician Assistant

## 2017-10-08 DIAGNOSIS — M5137 Other intervertebral disc degeneration, lumbosacral region: Secondary | ICD-10-CM

## 2017-10-08 NOTE — Telephone Encounter (Signed)
Copied from CRM [id number]. Topic: Quick Communication - Rx Refill/Question >> Oct 08, 2017 10:47 AM Darletta MollLander, Lumin L wrote: Medication: hydrocodone  Has the patient contacted their pharmacy? Yes.   (Agent: If no, request that the patient contact the pharmacy for the refill.) Preferred Pharmacy (with phone number or street name): [facility] [phone] - [place], [place] - [address] Agent: Please be advised that RX refills may take up to 3 business days. We ask that you follow-up with your pharmacy. Patient did not pick it up on 09/24/17 and it was not supposed to be called in until 10/08/2017.   [facility] is saying they will not refill the med for her b/c it was called in on 09/24/2017 and now won't be accepted again until 10/25/17.

## 2017-10-08 NOTE — Telephone Encounter (Signed)
See previous note regarding Hydrocodone.

## 2017-10-09 MED ORDER — HYDROCODONE-ACETAMINOPHEN 10-325 MG PO TABS: 1.0000 | ORAL_TABLET | Freq: Four times a day (QID) | ORAL | 0 refills | Status: DC | PRN

## 2017-10-09 MED ORDER — HYDROCODONE-ACETAMINOPHEN 10-325 MG PO TABS: ORAL_TABLET | ORAL | 0 refills | Status: DC

## 2017-10-09 NOTE — Telephone Encounter (Signed)
Left detailed message on vm per hippa form that her medication was sent to the pharmacy that they didn't receive and if she had any additional complaints or concerns to not hesitate in calling us back.

## 2017-10-09 NOTE — Telephone Encounter (Signed)
[facility] the most recent [facility] was 09/07/2017.  Rxs were written 09/24/2017 (3 rxs) and sent electronically to her pharmacy. Each is confirmed received by the pharmacy.  The pharmacist reports that only the one marked "may fill 30 days after the date on this prescription" was received.  Reorderd the 2 not received by the pharmacy.  Meds ordered this encounter  Medications  . HYDROcodone-acetaminophen (NORCO) 10-325 MG tablet    Sig: TAKE ONE TABLET BY MOUTH EVERY 6 HOURS AS NEEDED FOR PAIN.    Dispense:  120 tablet    Refill:  0    original Rx 09/24/17 not received by pharmacy    Order Specific Question:   Supervising Provider    Answer:   Clelia CroftSHAW, EVA N [[id number]]  . HYDROcodone-acetaminophen (NORCO) 10-325 MG tablet    Sig: Take 1 tablet by mouth every 6 (six) hours as needed (pain). May fill 60 days after date on prescription    Dispense:  120 tablet    Refill:  0    original Rx 09/24/17 not received by pharmacy    Order Specific Question:   Supervising Provider    Answer:   Sherren MochaSHAW, EVA N [[id number]]

## 2017-10-09 NOTE — Telephone Encounter (Signed)
Patient checking status, please advise. Call back [phone]

## 2017-10-09 NOTE — Telephone Encounter (Signed)
Medication management issue sent to [name].  Re: Hydrocodone

## 2017-10-21 ENCOUNTER — Other Ambulatory Visit: Payer: Self-pay | Admitting: Physician Assistant

## 2017-10-21 DIAGNOSIS — G2581 Restless legs syndrome: Secondary | ICD-10-CM

## 2017-10-29 NOTE — Progress Notes (Signed)
Cardiology Office Note  Date: 10/30/2017   ID: Jamie Walters, DOB 1961-08-20, MRN [id number]  PCP: Porfirio OarJeffery, [name], [name]  Consulting Cardiologist: Nona DellSamuel McDowell, MD   Chief Complaint  Patient presents with  . Abnormal heart sound    History of Present Illness: Jamie Walters is a 57 y.o. female referred for cardiology consultation by [name] for evaluation of an abnormal heart sound.  She states that over the last few routine visits that she has had, it was mentioned to her that she had a "gallop rhythm" noted on examination that had not been present previously.  She has been aware of having a heart murmur for some time.  She does not have any history of valvular heart disease that is known.  She has had no fevers or chills, no history of endocarditis.  She reports no unusual changes in stamina, no increasing shortness of breath or chest pain, no regular sense of palpitations.  She is limited by chronic back pain.  Limited records indicate that she was seen by Dr. Alanda AmassWeintraub years ago and referred for a cardiac catheterization done by Dr. Tresa EndoKelly in April 2012 which showed normal coronary arteries.  I personally reviewed her ECG today which shows sinus rhythm with R' in lead V1 and V2, nonspecific T wave abnormalities.  She does not recall ever having undergone an echocardiogram.  Past Medical History:  Diagnosis Date  . Allergy   . Arthritis   . Depression   . Hypertension     Past Surgical History:  Procedure Laterality Date  . TUBAL LIGATION      Current Outpatient Medications  Medication Sig Dispense Refill  . aspirin 81 MG tablet Take 81 mg by mouth daily.    . cyclobenzaprine (FLEXERIL) 10 MG tablet Take 1 tablet (10 mg total) by mouth at bedtime as needed for muscle spasms. 30 tablet 0  . HYDROcodone-acetaminophen (NORCO) 10-325 MG tablet Take 1 tablet by mouth every 6 (six) hours as needed (pain). May fill 30 days after date on prescription 120 tablet 0   . HYDROcodone-acetaminophen (NORCO) 10-325 MG tablet TAKE ONE TABLET BY MOUTH EVERY 6 HOURS AS NEEDED FOR PAIN. 120 tablet 0  . HYDROcodone-acetaminophen (NORCO) 10-325 MG tablet Take 1 tablet by mouth every 6 (six) hours as needed (pain). May fill 60 days after date on prescription 120 tablet 0  . hydrOXYzine (ATARAX/VISTARIL) 25 MG tablet Take 0.5-2 tablets (12.5-50 mg total) by mouth at bedtime as needed for anxiety or itching (insomnia). 60 tablet 3  . metoprolol succinate (TOPROL-XL) 25 MG 24 hr tablet Take 1 tablet (25 mg total) by mouth daily. 90 tablet 3  . pramipexole (MIRAPEX) 0.125 MG tablet TAKE 1 TABLET AT BEDTIME FOR 7 DAYS,2 TABLETS EVERY EVENING FOR 7 DAYS,3 TABLETS FOR 7 DAYS,4 TABLETS FOR 7 DAYS 70 tablet 0  . pravastatin (PRAVACHOL) 40 MG tablet Take 1 tablet (40 mg total) by mouth daily. 90 tablet 3  . buPROPion (WELLBUTRIN XL) 150 MG 24 hr tablet Take 3 tablets (450 mg total) by mouth daily. 270 tablet 3   No current facility-administered medications for this visit.    Allergies:  Patient has no known allergies.   Social History: The patient  reports that she has been smoking cigarettes.  She has a 10.00 pack-year smoking history. she has never used smokeless tobacco. She reports that she does not drink alcohol or use drugs.   Family History: The patient's family history includes Alcohol abuse  in her father; Arthritis in her father; COPD in her brother, brother, and father; Cancer in her father; Hyperlipidemia in her mother; Hypertension in her mother and sister; Mental illness in her son; Stroke in her mother.   ROS:  Please see the history of present illness. Otherwise, complete review of systems is positive for chronic back pain.  All other systems are reviewed and negative.   Physical Exam: VS:  BP 138/80 (BP Location: Right Arm)   Pulse 91   Ht 5\' 2"  (1.575 m)   Wt 144 lb (65.3 kg)   SpO2 96%   BMI 26.34 kg/m , BMI Body mass index is 26.34 kg/m.  Wt Readings  from Last 3 Encounters:  10/30/17 144 lb (65.3 kg)  09/24/17 144 lb 3.2 oz (65.4 kg)  07/19/17 140 lb 6.4 oz (63.7 kg)    General: Patient appears comfortable at rest. HEENT: Conjunctiva and lids normal, oropharynx clear. Neck: Supple, no elevated JVP or carotid bruits, no thyromegaly. Lungs: Clear to auscultation, nonlabored breathing at rest. Cardiac: Regular rate and rhythm, no S3, 2/6 early to mid systolic murmur with midsystolic click toward the apex, no pericardial rub. Abdomen: Soft, nontender, bowel sounds present. Extremities: No pitting edema, distal pulses 2+. Skin: Warm and dry. Musculoskeletal: No kyphosis. Neuropsychiatric: Alert and oriented x3, affect grossly appropriate.  ECG: I personally reviewed the tracing from 04/06/2010 which showed sinus rhythm with nonspecific ST changes.  Recent Labwork: 12/23/2016: ALT 18; AST 22; BUN 10; Creatinine, Ser 0.73; Hemoglobin 13.1; Platelets 294; Potassium 4.0; Sodium 142; TSH 4.010     Component Value Date/Time   CHOL 206 (H) 12/23/2016 1427   TRIG 143 12/23/2016 1427   HDL 51 12/23/2016 1427   CHOLHDL 4.0 12/23/2016 1427   CHOLHDL 4.5 05/02/2015 1152   VLDL 28 05/02/2015 1152   LDLCALC 126 (H) 12/23/2016 1427    Other Studies Reviewed Today:  Cardiac catheterization 12/18/2010: HEMODYNAMIC DATA:  Central aortic pressure is 120/66.  Left ventricular pressure 120/15.  ANGIOGRAPHIC DATA:  Left main coronary artery was angiographically normal and bifurcated into an LAD and circumflex system.  The LAD was angiographically normal gave rise to several diagonal septal perforating arteries and was free of disease.  The circumflex vessel was angiographically normal and gave rise to one major marginal vessel.  The right coronary artery was a large-caliber dominant vessel which gave rise to a PDA and posterolateral system.  Left ventriculography revealed normal LV contractility with an ejection fraction of approximately  65% without focal segmental wall motion abnormalities.  Distal aortography revealed widely patent renal arteries.  There was no significant aortoiliac disease.  IMPRESSION: 1. Normal left ventricular function with an ejection fraction of     approximately 65%. 2. Normal coronary arteries. 3. Normal aortoiliac system.  Assessment and Plan:  1.  Systolic murmur with midsystolic click on examination.  Plan is to obtain an echocardiogram to assess cardiac structure and function.  She may have mitral valve prolapse that was previously undiagnosed.  2.  Essential hypertension, on Toprol-XL.  3.  Ongoing tobacco abuse.  4.  Mixed hyperlipidemia, on Pravachol.  Current medicines were reviewed with the patient today.   Orders Placed This Encounter  Procedures  . EKG 12-Lead  . ECHOCARDIOGRAM COMPLETE    Disposition: Call with test results.  Signed, Jonelle Sidle, MD, [facility] 10/30/2017 3:15 PM    [facility] at [facility] [address] [place], [place], [place] [address] Phone: [phone]; Fax: [phone])  [phone] 

## 2017-10-30 ENCOUNTER — Encounter: Payer: Self-pay | Admitting: Cardiology

## 2017-10-30 ENCOUNTER — Ambulatory Visit (INDEPENDENT_AMBULATORY_CARE_PROVIDER_SITE_OTHER): Payer: 59 | Admitting: Cardiology

## 2017-10-30 VITALS — BP 138/80 | HR 91 | Ht 62.0 in | Wt 144.0 lb

## 2017-10-30 DIAGNOSIS — E782 Mixed hyperlipidemia: Secondary | ICD-10-CM

## 2017-10-30 DIAGNOSIS — R011 Cardiac murmur, unspecified: Secondary | ICD-10-CM

## 2017-10-30 DIAGNOSIS — I10 Essential (primary) hypertension: Secondary | ICD-10-CM

## 2017-10-30 DIAGNOSIS — R012 Other cardiac sounds: Secondary | ICD-10-CM | POA: Diagnosis not present

## 2017-10-30 DIAGNOSIS — Z720 Tobacco use: Secondary | ICD-10-CM | POA: Diagnosis not present

## 2017-10-30 NOTE — Patient Instructions (Signed)

## 2017-11-13 ENCOUNTER — Ambulatory Visit (INDEPENDENT_AMBULATORY_CARE_PROVIDER_SITE_OTHER): Payer: 59

## 2017-11-13 ENCOUNTER — Other Ambulatory Visit: Payer: Self-pay

## 2017-11-13 DIAGNOSIS — R011 Cardiac murmur, unspecified: Secondary | ICD-10-CM

## 2017-11-13 LAB — ECHOCARDIOGRAM COMPLETE
AVLVOTPG: 12 mmHg
CHL CUP TV REG PEAK VELOCITY: 210 cm/s
E decel time: 304 msec
E/e' ratio: 8.21
FS: 40 % (ref 28–44)
IVS/LV PW RATIO, ED: 1.01
LA diam end sys: 32 mm
LA diam index: 1.93 cm/m2
LA vol index: 24 mL/m2
LA vol: 39.8 mL
LASIZE: 32 mm
LAVOLA4C: 38.7 mL
LDCA: 2.54 cm2
LV E/e' medial: 8.21
LV E/e'average: 8.21
LV PW d: 11.3 mm — AB (ref 0.6–1.1)
LV TDI E'LATERAL: 8.9
LV TDI E'MEDIAL: 4.84
LV dias vol index: 41 mL/m2
LV sys vol: 17 mL (ref 14–42)
LVDIAVOL: 68 mL (ref 46–106)
LVELAT: 8.9 cm/s
LVOT SV: 98 mL
LVOT VTI: 38.4 cm
LVOT peak vel: 171 cm/s
LVOTD: 18 mm
LVSYSVOLIN: 10 mL/m2
Lateral S' vel: 11.3 cm/s
MV Dec: 304
MV Peak grad: 2 mmHg
MV pk E vel: 73.1 m/s
MVPKAVEL: 80.5 m/s
RV TAPSE: 27 mm
RV sys press: 21 mmHg
Simpson's disk: 75
Stroke v: 51 ml
TRMAXVEL: 210 cm/s

## 2017-11-14 ENCOUNTER — Telehealth: Payer: Self-pay

## 2017-11-14 NOTE — Telephone Encounter (Signed)
Patient notified. Routed to PCP 

## 2017-11-14 NOTE — Telephone Encounter (Signed)
-----   Message from Norva PavlovKailey Dealva Lafoy, LPN sent at 1/6/10963/09/2017 10:13 AM EST -----   ----- Message ----- From: Jonelle SidleMcDowell, Samuel G, MD Sent: 11/14/2017   8:29 AM To: Porfirio Oarhelle Jeffery, [name], Eustace MooreLydia M Anderson, LPN  Results reviewed.  I also reviewed the images myself.  Despite physical exam findings, she does not have any significant degree of mitral valve prolapse or other major valvular abnormality, also LVEF normal.  This is reassuring.  No further cardiac testing is recommended at this time. A copy of this test should be forwarded to Porfirio OarJeffery, [name], [name].

## 2017-11-17 ENCOUNTER — Other Ambulatory Visit: Payer: Self-pay | Admitting: Physician Assistant

## 2017-11-17 DIAGNOSIS — G2581 Restless legs syndrome: Secondary | ICD-10-CM

## 2017-11-17 NOTE — Telephone Encounter (Signed)
Mirapex refill LOV: 09/24/17 PCP: Theora Gianottihelle Jeffrey PA Pharmacy: [facility]  [address]

## 2017-12-06 ENCOUNTER — Other Ambulatory Visit: Payer: Self-pay | Admitting: Physician Assistant

## 2017-12-06 DIAGNOSIS — M5116 Intervertebral disc disorders with radiculopathy, lumbar region: Secondary | ICD-10-CM

## 2017-12-22 ENCOUNTER — Encounter: Payer: Self-pay | Admitting: Physician Assistant

## 2017-12-25 ENCOUNTER — Telehealth: Payer: Self-pay | Admitting: Physician Assistant

## 2017-12-25 NOTE — Telephone Encounter (Signed)
Called and left VM for pt advising of [name] leaving the practice. I advised him to call the office and make an appt with another provider.  °

## 2017-12-31 ENCOUNTER — Other Ambulatory Visit: Payer: Self-pay | Admitting: Physician Assistant

## 2018-01-05 ENCOUNTER — Other Ambulatory Visit: Payer: Self-pay | Admitting: Physician Assistant

## 2018-01-05 DIAGNOSIS — M5137 Other intervertebral disc degeneration, lumbosacral region: Secondary | ICD-10-CM

## 2018-01-05 NOTE — Telephone Encounter (Signed)
Patient is requesting a refill of the following medications: Requested Prescriptions   Pending Prescriptions Disp Refills  . HYDROcodone-acetaminophen (NORCO) 10-325 MG tablet 120 tablet 0    Sig: Take 1 tablet by mouth every 6 (six) hours as needed (pain). May fill 30 days after date on prescription    Date of patient request: 01/05/18 Last office visit: 09/24/17 Date of last refill: 10/09/17 Last refill amount: #120 0RF  Follow up time period per chart: had appt sched for 03/24/18 but cancelled it

## 2018-01-05 NOTE — Telephone Encounter (Signed)
Copied from CRM [id number]. Topic: Quick Communication - Rx Refill/Question >> Jan 05, 2018  9:13 AM Gerrianne ScalePayne, Yarelly Kuba L wrote: Medication: HYDROcodone-acetaminophen (NORCO) 10-325 MG tablet Has the patient contacted their pharmacy? Yes.  Phamacy told pt they cant send over a request to call their provider (Agent: If no, request that the patient contact the pharmacy for the refill.) Preferred Pharmacy (with phone number or street name):     [facility] [phone] - [place], [place] - [address] [phone] (Phone) [phone] (Fax)     Agent: Please be advised that RX refills may take up to 3 business days. We ask that you follow-up with your pharmacy.

## 2018-01-05 NOTE — Telephone Encounter (Signed)
Rx refill request: hydrocodone acetaminophen 10-325 mg     Last filled 10/09/17 # 120  LOV: 09/24/17  PCP: Leotis ShamesJeffery  Pharmacy: verified

## 2018-01-06 MED ORDER — HYDROCODONE-ACETAMINOPHEN 10-325 MG PO TABS: 1.0000 | ORAL_TABLET | Freq: Four times a day (QID) | ORAL | 0 refills | Status: AC | PRN

## 2018-01-06 MED ORDER — HYDROCODONE-ACETAMINOPHEN 10-325 MG PO TABS: ORAL_TABLET | ORAL | 0 refills | Status: AC

## 2018-01-06 NOTE — Telephone Encounter (Signed)
Rx sent electronically.  Meds ordered this encounter  Medications  . HYDROcodone-acetaminophen (NORCO) 10-325 MG tablet    Sig: Take 1 tablet by mouth every 6 (six) hours as needed (pain).    Dispense:  120 tablet    Refill:  0  . HYDROcodone-acetaminophen (NORCO) 10-325 MG tablet    Sig: TAKE ONE TABLET BY MOUTH EVERY 6 HOURS AS NEEDED FOR PAIN.    Dispense:  120 tablet    Refill:  0    Order Specific Question:   Supervising Provider    Answer:   SHAW, EVA N [[id number]]  . HYDROcodone-acetaminophen (NORCO) 10-325 MG tablet    Sig: Take 1 tablet by mouth every 6 (six) hours as needed (pain). May fill 60 days after date on prescription    Dispense:  120 tablet    Refill:  0    Order Specific Question:   Supervising Provider    Answer:   Clelia CroftSHAW, EVA N [[id number]]

## 2018-01-06 NOTE — Telephone Encounter (Signed)
Message routed to provider

## 2018-01-06 NOTE — Telephone Encounter (Signed)
Pt calling in checking on status. Hoping it can be called in today and she pick it up on her way home. She is out of the medication.

## 2018-01-19 ENCOUNTER — Other Ambulatory Visit: Payer: Self-pay | Admitting: Physician Assistant

## 2018-01-19 DIAGNOSIS — F329 Major depressive disorder, single episode, unspecified: Secondary | ICD-10-CM

## 2018-01-19 DIAGNOSIS — F32A Depression, unspecified: Secondary | ICD-10-CM

## 2018-01-25 ENCOUNTER — Other Ambulatory Visit: Payer: Self-pay | Admitting: Physician Assistant

## 2018-01-25 DIAGNOSIS — F32A Depression, unspecified: Secondary | ICD-10-CM

## 2018-01-25 DIAGNOSIS — F329 Major depressive disorder, single episode, unspecified: Secondary | ICD-10-CM

## 2018-01-27 NOTE — Addendum Note (Signed)
Addended by: Oneida Alar on: 01/27/2018 03:23 PM   Modules accepted: Orders

## 2018-01-27 NOTE — Telephone Encounter (Signed)
PT called to see if she can get the rx filled since she has an apt with [name]  7/16    At other office

## 2018-01-27 NOTE — Telephone Encounter (Addendum)
Provider, please advise on bupropion  refill. Request initially denied due to need for office visit, patient scheduled with you at date below. Do you want her to come in sooner?

## 2018-01-27 NOTE — Telephone Encounter (Signed)
Pt checking on the status of refill request

## 2018-01-27 NOTE — Telephone Encounter (Signed)
Please schedule OV.  

## 2018-01-28 ENCOUNTER — Telehealth: Payer: Self-pay | Admitting: Physician Assistant

## 2018-01-28 MED ORDER — BUPROPION HCL ER (XL) 150 MG PO TB24: 450.0000 mg | ORAL_TABLET | Freq: Every day | ORAL | 3 refills | Status: AC

## 2018-01-28 NOTE — Telephone Encounter (Signed)
Copied from CRM [id number]. Topic: Quick Communication - Rx Refill/Question >> Jan 28, 2018 12:24 PM Alexander Bergeron B wrote: Medication: buPROPion (WELLBUTRIN XL) 150 MG 24 hr tablet [[id number]]  ENDED  Has the patient contacted their pharmacy? Yes.   (Agent: If no, request that the patient contact the pharmacy for the refill.) [facility] ([facility] phone number [facility] Agent: Please be advised that RX refills may take up to 3 business days. We ask that you follow-up with your pharmacy.

## 2018-01-28 NOTE — Telephone Encounter (Signed)
Noted already refilled today by provider.

## 2018-01-28 NOTE — Telephone Encounter (Signed)
I'll see her in July.  Meds ordered this encounter  Medications  . buPROPion (WELLBUTRIN XL) 150 MG 24 hr tablet    Sig: Take 3 tablets (450 mg total) by mouth daily.    Dispense:  270 tablet    Refill:  3

## 2018-01-28 NOTE — Addendum Note (Signed)
Addended by: Fernande Bras on: 01/28/2018 01:06 PM   Modules accepted: Orders

## 2018-01-29 ENCOUNTER — Other Ambulatory Visit: Payer: Self-pay | Admitting: Physician Assistant

## 2018-01-29 DIAGNOSIS — E785 Hyperlipidemia, unspecified: Secondary | ICD-10-CM

## 2018-02-04 ENCOUNTER — Other Ambulatory Visit: Payer: Self-pay | Admitting: Physician Assistant

## 2018-02-04 DIAGNOSIS — E785 Hyperlipidemia, unspecified: Secondary | ICD-10-CM

## 2018-03-05 ENCOUNTER — Other Ambulatory Visit: Payer: Self-pay | Admitting: Physician Assistant

## 2018-03-05 DIAGNOSIS — G2581 Restless legs syndrome: Secondary | ICD-10-CM

## 2018-03-06 NOTE — Telephone Encounter (Signed)
Mirapex refill Last OV:09/24/17 Last refill:11/17/17 70 tab/5 refills ZOX:WRUEAVWPCP:Jeffery; [place] to [place] Pharmacy: [facility] [phone] - [place], [place] - [address] [phone] (Phone) [phone] (Fax)

## 2018-03-07 NOTE — Telephone Encounter (Signed)
Please advise 

## 2018-03-09 NOTE — Telephone Encounter (Signed)
Please call patient and ask her how many tablets she is taking at bedtime as the original prescription was a titration one. thanks

## 2018-03-10 NOTE — Telephone Encounter (Signed)
lmvm to call me back CRM entered

## 2018-03-24 ENCOUNTER — Ambulatory Visit: Payer: 59 | Admitting: Physician Assistant

## 2018-10-15 IMAGING — DX DG LUMBAR SPINE COMPLETE 4+V
5 series · 5 of 5 positions shown · non-contrast
Comparison: MRI of the lumbar spine report dated July 17, 2011
though images are not available for direct comparison.

CLINICAL DATA: Chronic low back pain, now with RIGHT lower
extremity paresthesias.

EXAM:
LUMBAR SPINE - COMPLETE 4+ VIEW

[l-spine ap]
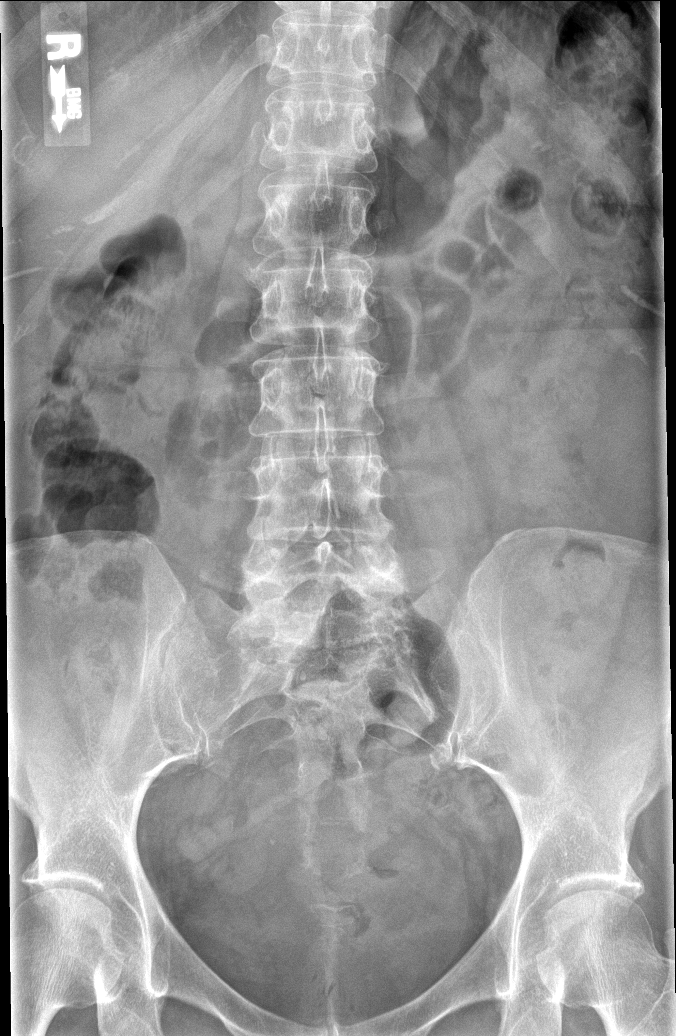

[l-spine obl (1 of 2)]
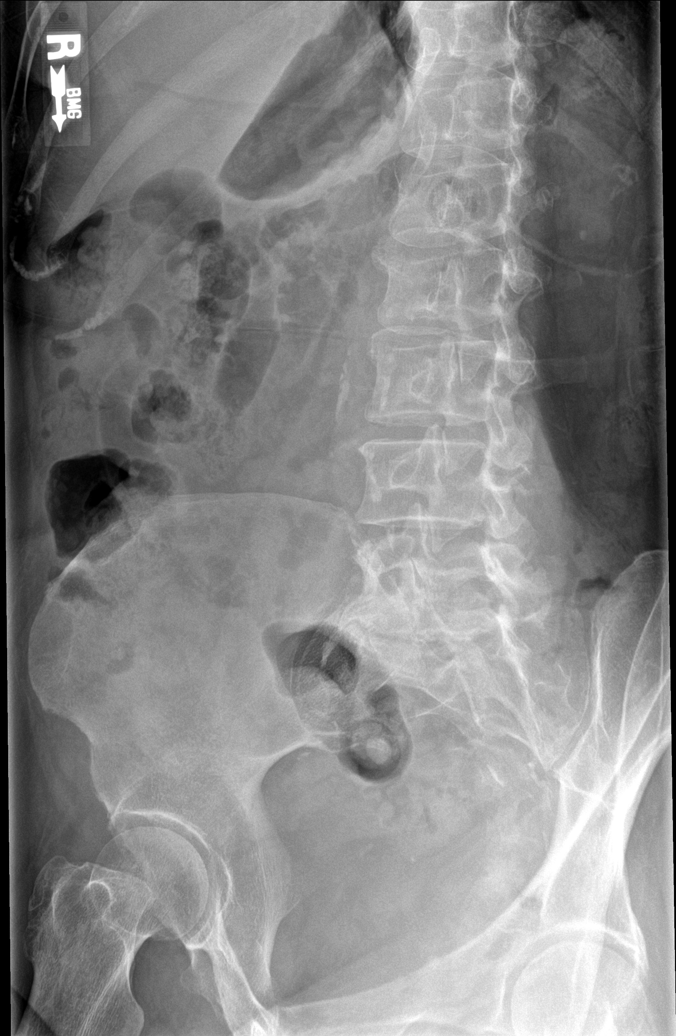

[l-spine obl (2 of 2)]
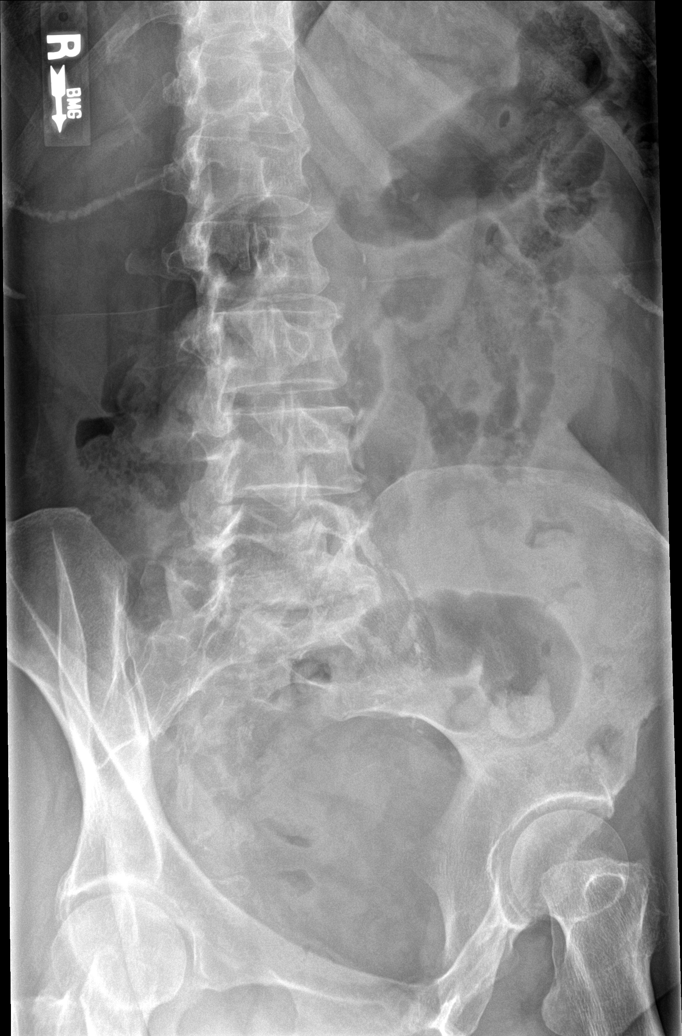

[l-spine lat]
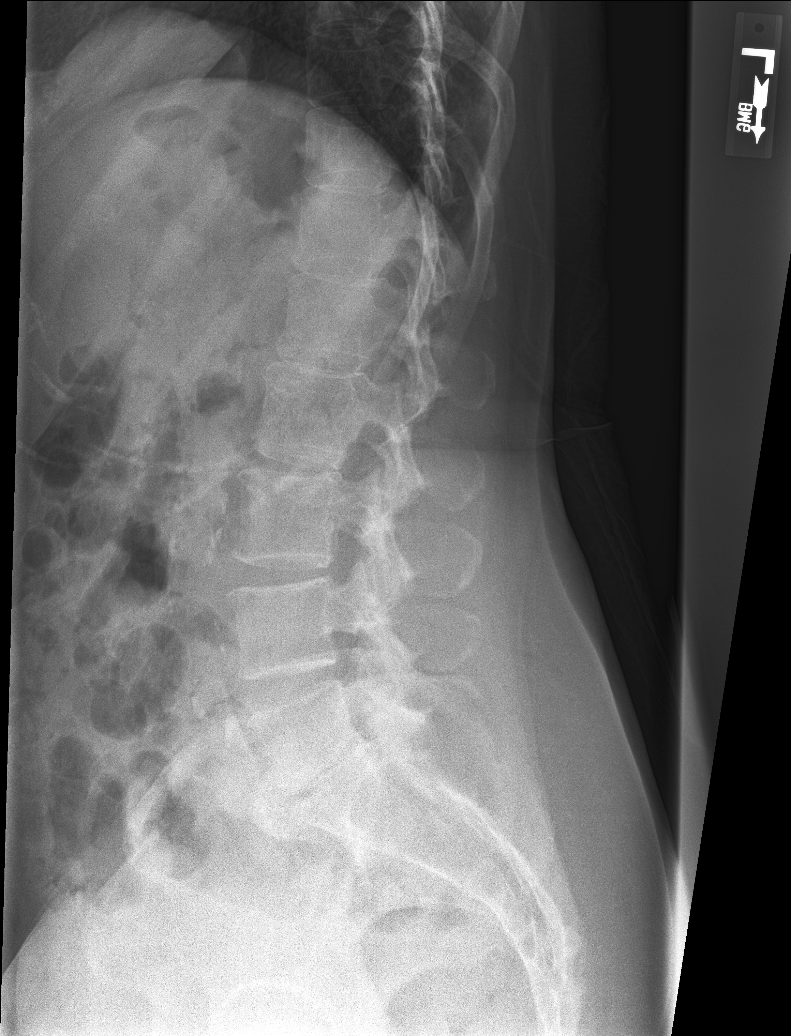

[l-spine l5-s1]
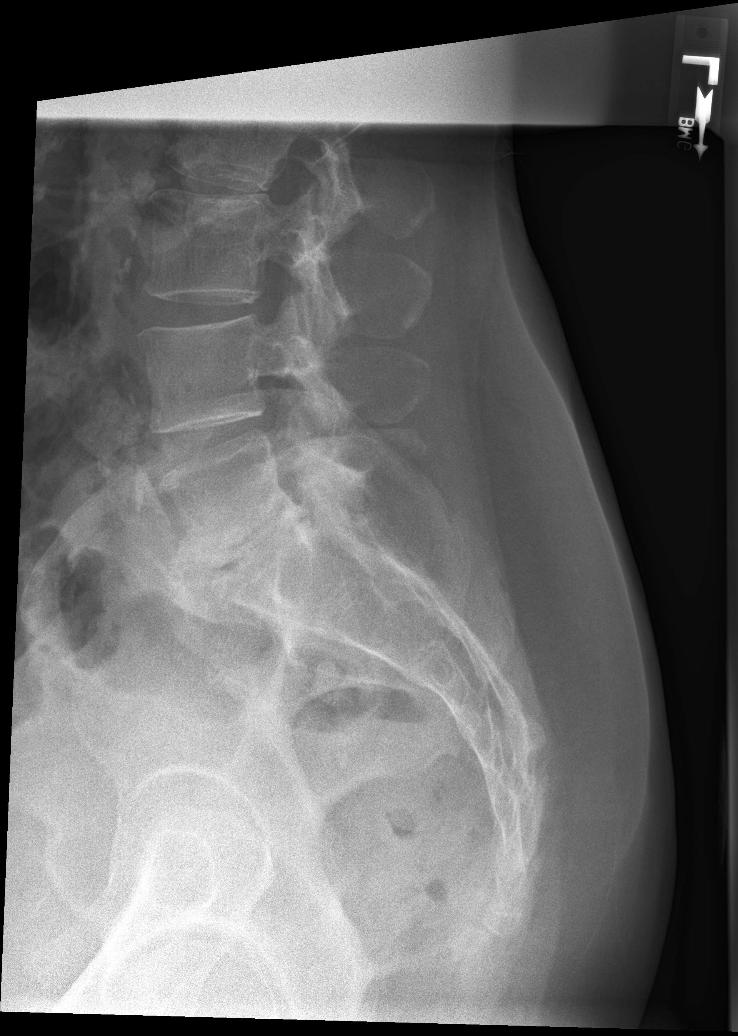

[5 of 5 positions shown; findings below may reference images not displayed]

FINDINGS: Five non rib-bearing lumbar-type vertebral bodies are intact and
aligned with maintenance of the lumbar lordosis. Severe L5-S1 disc
height loss with endplate spurring compatible with degenerative
disc. Mild L1-2 thru L3-4 ventral endplate spurring. Moderate lower
lumbar facet arthropathy. No destructive bony lesions.

Sacroiliac joints are symmetric. Included prevertebral and
paraspinal soft tissue planes are non-suspicious. Moderate vascular
calcifications.
IMPRESSION: Severe L5-S1 degenerative discs without acute fracture deformity or
malalignment.

Moderate atherosclerosis.

## 2020-07-10 ENCOUNTER — Other Ambulatory Visit: Payer: Self-pay

## 2020-07-10 ENCOUNTER — Other Ambulatory Visit (HOSPITAL_COMMUNITY): Payer: Self-pay | Admitting: Physician Assistant

## 2020-07-10 ENCOUNTER — Ambulatory Visit (HOSPITAL_COMMUNITY)
Admission: RE | Admit: 2020-07-10 | Discharge: 2020-07-10 | Disposition: A | Discharge status: Home / Self Care | Payer: 59 | Source: Ambulatory Visit | Attending: Physician Assistant | Admitting: Physician Assistant

## 2020-07-10 DIAGNOSIS — M5030 Other cervical disc degeneration, unspecified cervical region: Secondary | ICD-10-CM | POA: Insufficient documentation

## 2020-07-10 DIAGNOSIS — M62830 Muscle spasm of back: Secondary | ICD-10-CM | POA: Insufficient documentation

## 2021-04-08 ENCOUNTER — Emergency Department (HOSPITAL_COMMUNITY): Payer: 59

## 2021-04-08 ENCOUNTER — Emergency Department (HOSPITAL_COMMUNITY)
Admission: EM | Admit: 2021-04-08 | Discharge: 2021-04-08 | Disposition: A | Discharge status: Home / Self Care | Payer: 59 | Attending: Emergency Medicine | Admitting: Emergency Medicine

## 2021-04-08 ENCOUNTER — Encounter (HOSPITAL_COMMUNITY): Payer: Self-pay | Admitting: Emergency Medicine

## 2021-04-08 ENCOUNTER — Other Ambulatory Visit: Payer: Self-pay

## 2021-04-08 DIAGNOSIS — M4714 Other spondylosis with myelopathy, thoracic region: Secondary | ICD-10-CM | POA: Diagnosis not present

## 2021-04-08 DIAGNOSIS — M456 Ankylosing spondylitis lumbar region: Secondary | ICD-10-CM | POA: Diagnosis present

## 2021-04-08 DIAGNOSIS — M4712 Other spondylosis with myelopathy, cervical region: Secondary | ICD-10-CM | POA: Diagnosis not present

## 2021-04-08 DIAGNOSIS — Z79899 Other long term (current) drug therapy: Secondary | ICD-10-CM | POA: Insufficient documentation

## 2021-04-08 DIAGNOSIS — Z7982 Long term (current) use of aspirin: Secondary | ICD-10-CM | POA: Insufficient documentation

## 2021-04-08 DIAGNOSIS — I10 Essential (primary) hypertension: Secondary | ICD-10-CM | POA: Insufficient documentation

## 2021-04-08 DIAGNOSIS — F17210 Nicotine dependence, cigarettes, uncomplicated: Secondary | ICD-10-CM | POA: Insufficient documentation

## 2021-04-08 LAB — COMPREHENSIVE METABOLIC PANEL
ALT: 25 U/L (ref 0–44)
AST: 26 U/L (ref 15–41)
Albumin: 3.7 g/dL (ref 3.5–5.0)
Alkaline Phosphatase: 77 U/L (ref 38–126)
Anion gap: 9 (ref 5–15)
BUN: 30 mg/dL — ABNORMAL HIGH (ref 8–23)
CO2: 25 mmol/L (ref 22–32)
Calcium: 9.2 mg/dL (ref 8.9–10.3)
Chloride: 102 mmol/L (ref 98–111)
Creatinine, Ser: 1.61 mg/dL — ABNORMAL HIGH (ref 0.44–1.00)
GFR, Estimated: 36 mL/min — ABNORMAL LOW (ref >60)
Glucose, Bld: 129 mg/dL — ABNORMAL HIGH (ref 70–99)
Potassium: 3.5 mmol/L (ref 3.5–5.1)
Sodium: 136 mmol/L (ref 135–145)
Total Bilirubin: 0.4 mg/dL (ref 0.3–1.2)
Total Protein: 6.7 g/dL (ref 6.5–8.1)

## 2021-04-08 LAB — URINALYSIS, ROUTINE W REFLEX MICROSCOPIC
Bilirubin Urine: NEGATIVE
Glucose, UA: NEGATIVE mg/dL
Hgb urine dipstick: NEGATIVE
Ketones, ur: NEGATIVE mg/dL
Leukocytes,Ua: NEGATIVE
Nitrite: NEGATIVE
Protein, ur: NEGATIVE mg/dL
Specific Gravity, Urine: 1.01 (ref 1.005–1.030)
pH: 5 (ref 5.0–8.0)

## 2021-04-08 LAB — CBC WITH DIFFERENTIAL/PLATELET
Abs Immature Granulocytes: 0.05 10*3/uL (ref 0.00–0.07)
Basophils Absolute: 0 10*3/uL (ref 0.0–0.1)
Basophils Relative: 0 %
Eosinophils Absolute: 0.1 10*3/uL (ref 0.0–0.5)
Eosinophils Relative: 1 %
HCT: 38 % (ref 36.0–46.0)
Hemoglobin: 12.6 g/dL (ref 12.0–15.0)
Immature Granulocytes: 1 %
Lymphocytes Relative: 33 %
Lymphs Abs: 3 10*3/uL (ref 0.7–4.0)
MCH: 31.3 pg (ref 26.0–34.0)
MCHC: 33.2 g/dL (ref 30.0–36.0)
MCV: 94.3 fL (ref 80.0–100.0)
Monocytes Absolute: 1 10*3/uL (ref 0.1–1.0)
Monocytes Relative: 10 %
Neutro Abs: 5 10*3/uL (ref 1.7–7.7)
Neutrophils Relative %: 55 %
Platelets: 278 10*3/uL (ref 150–400)
RBC: 4.03 MIL/uL (ref 3.87–5.11)
RDW: 13.3 % (ref 11.5–15.5)
WBC: 9.2 10*3/uL (ref 4.0–10.5)
nRBC: 0 % (ref 0.0–0.2)

## 2021-04-08 MED ORDER — SODIUM CHLORIDE 0.9 % IV BOLUS
1000.0000 mL | Freq: Once | INTRAVENOUS | Status: AC
  Administered 2021-04-08: 1000 mL via INTRAVENOUS

## 2021-04-08 MED ORDER — GADOBUTROL 1 MMOL/ML IV SOLN
6.5000 mL | Freq: Once | INTRAVENOUS | Status: AC | PRN
  Administered 2021-04-08: 6.5 mL via INTRAVENOUS

## 2021-04-08 NOTE — ED Notes (Signed)
Pt remains in MRI 

## 2021-04-08 NOTE — ED Provider Notes (Signed)
[facility] EMERGENCY DEPARTMENT Provider Note   CSN: [id number] Arrival date & time: 04/08/21  1110     History No chief complaint on file.   Jamie Walters is a 60 y.o. female.  HPI Patient is a 60 year old female with a medical history as noted below.  She presents to the emergency department due to worsening chronic back pain as well as right-sided numbness/tingling for the past 4 months.  She states she has been following up with her back specialist and 2 days ago had an MRI of her head, cervical spine, as well as lumbar spine without contrast.  Findings below.  She states that she was given a referral to neurosurgery but after her imaging resulted her back specialist then contacted her yesterday and told her to come to the emergency department for evaluation and likely neurosurgery consult.  She reports subjective decrease in sensation diffusely across the right side of her body as well as upper back pain that is particularly worse along the right scapular region.  Denies any bowel or bladder incontinence or saddle anesthesia.  No urinary complaints, abdominal pain, back pain, nausea, vomiting, diarrhea.  Denies any recent medication changes.  Does note more frequent falls since her symptoms began.  IMPRESSION:  1.  No acute intracranial abnormality.  2.  Few small discrete white matter lesions are identified. These lesions are abnormal but nonspecific, usually resulting from benign/remote/incidental causes (e.g. prior trauma/inflammation/demyelinization, or chronic ischemia associated with migraines/atherosclerosis/other vasculopathies). Favor chronicmicrovascular disease.   IMPRESSION:  1.  Cervical spondylosis and facet arthrosis as described above with a grade 1 anterolisthesis at T1-T2. This is most notable for moderate spinal canal narrowing with cord compression and severe RIGHT and moderate LEFT neuroforaminal stenosis at T1-T2. In addition, there is faint  abnormal increased T2-weighted signal intensity in the spinal cord at this level that may represent cord edema.  2.  Severe RIGHT and moderate LEFT neuroforaminal stenosis and mild spinal canal narrowing at C4-C5.  3.  Severe bilateral neuroforaminal stenosis and moderate spinal canal narrowing at C5-C6.  4.  Moderate bilateral neuroforaminal stenosis and mild spinal canal narrowing at C6-C7.  5.  Mild to moderate RIGHT neuroforaminal stenosis at C7-T1.   IMPRESSION:  1.  Degenerative disc disease and facet arthrosis as described above. This is most notable for moderate bilateral neuroforaminal stenosis and mild lateral recess narrowing at L5-S1.  2.  2 mm rounded intradural lesion posterior to the cauda equina at the L3 level. This may represent a nerve sheath tumor versus meningioma versusartifact, recommend postcontrast imaging for further evaluation.       Past Medical History:  Diagnosis Date   Allergy    Arthritis    Depression    Hypertension     Patient Active Problem List   Diagnosis Date Noted   Restless legs syndrome (RLS) 09/24/2017   Right medial knee pain 07/19/2017   Gallop rhythm 07/19/2017   Tobacco abuse 07/02/2016   Chronic prescription opiate use 07/02/2016   Hyperplastic colon polyp 03/09/2014   HTN (hypertension) 12/03/2012   Hyperlipidemia 12/03/2012   Mild carotid artery disease (HCC) 12/03/2012   Lumbar disc disease with radiculopathy 12/03/2012   Depression 12/03/2012   HA (headache) 12/03/2012    Past Surgical History:  Procedure Laterality Date   TUBAL LIGATION       OB History   No obstetric history on file.     Family History  Problem Relation Age of Onset   Hypertension  Mother    Hyperlipidemia Mother    Stroke Mother    COPD Father    Alcohol abuse Father    Arthritis Father    Cancer Father        bone cancer   Hypertension Sister    COPD Brother    COPD Brother    Mental illness Son        PTSD; [place]     Social History   Tobacco Use   Smoking status: Every Day    Packs/day: 0.25    Years: 40.00    Pack years: 10.00    Types: Cigarettes   Smokeless tobacco: Never   Tobacco comments:    Never started Chantix Rx due to smoker in house. Has cut down to .25 daily  Vaping Use   Vaping Use: Never used  Substance Use Topics   Alcohol use: No    Alcohol/week: 0.0 standard drinks   Drug use: No    Home Medications Prior to Admission medications   Medication Sig Start Date End Date Taking? Authorizing Provider  aspirin 81 MG tablet Take 81 mg by mouth daily.    [provider]  buPROPion (WELLBUTRIN XL) 150 MG 24 hr tablet Take 3 tablets (450 mg total) by mouth daily. 01/28/18 04/28/18  Porfirio OarJeffery, [name], PA  cyclobenzaprine (FLEXERIL) 10 MG tablet Take 1 tablet (10 mg total) by mouth at bedtime as needed for muscle spasms. 09/01/17   Porfirio OarJeffery, [name], PA  hydrOXYzine (ATARAX/VISTARIL) 25 MG tablet Take 0.5-2 tablets (12.5-50 mg total) by mouth at bedtime as needed for anxiety or itching (insomnia). 09/24/17   Porfirio OarJeffery, [name], PA  metoprolol succinate (TOPROL-XL) 25 MG 24 hr tablet Take 1 tablet (25 mg total) by mouth daily. 07/19/17   Porfirio OarJeffery, [name], PA  pramipexole (MIRAPEX) 0.125 MG tablet TAKE 1 TABLET AT BEDTIME FOR 7 DAYS,2 TABLETS EVERY EVENING FOR 7 DAYS,3 TABLETS FOR 7 DAYS,4 TABLETS FOR 7 DAYS 11/17/17   Porfirio OarJeffery, [name], PA  pravastatin (PRAVACHOL) 40 MG tablet TAKE 1 TABLET BY MOUTH DAILY 02/04/18   Porfirio OarJeffery, [name], PA    Allergies    Patient has no known allergies.  Review of Systems   Review of Systems  All other systems reviewed and are negative. Ten systems reviewed and are negative for acute change, except as noted in the HPI.   Physical Exam Updated Vital Signs BP 117/62   Pulse 73   Temp 98.5 F (36.9 C) (Oral)   Resp 17   Ht 5\' 2"  (1.575 m)   Wt 65.3 kg   SpO2 100%   BMI 26.34 kg/m   Physical Exam Vitals and nursing note reviewed.  Constitutional:       General: She is not in acute distress.    Appearance: Normal appearance. She is not ill-appearing, toxic-appearing or diaphoretic.  HENT:     Head: Normocephalic and atraumatic.     Right Ear: External ear normal.     Left Ear: External ear normal.     Nose: Nose normal.     Mouth/Throat:     Mouth: Mucous membranes are moist.     Pharynx: Oropharynx is clear. No oropharyngeal exudate or posterior oropharyngeal erythema.  Eyes:     General: No scleral icterus.       Right eye: No discharge.        Left eye: No discharge.     Extraocular Movements: Extraocular movements intact.     Conjunctiva/sclera: Conjunctivae normal.  Cardiovascular:  Rate and Rhythm: Normal rate and regular rhythm.     Pulses: Normal pulses.     Heart sounds: Normal heart sounds. No murmur heard.   No friction rub. No gallop.  Pulmonary:     Effort: Pulmonary effort is normal. No respiratory distress.     Breath sounds: Normal breath sounds. No stridor. No wheezing, rhonchi or rales.  Abdominal:     General: Abdomen is flat.     Palpations: Abdomen is soft.     Tenderness: There is no abdominal tenderness.  Musculoskeletal:        General: Tenderness present. Normal range of motion.     Cervical back: Normal range of motion and neck supple. No tenderness.     Comments: Mild tenderness appreciated in the right scapular region.  No midline spine pain.  Skin:    General: Skin is warm and dry.  Neurological:     General: No focal deficit present.     Mental Status: She is alert and oriented to person, place, and time.     Comments: A&O x3.  Extraocular movements intact.  Moving all 4 extremities with no obvious deficits.  Strength is 5/5 in all 4 extremities.  Subjective decrease in sensation diffusely along the right arm and leg.  Psychiatric:        Mood and Affect: Mood normal.        Behavior: Behavior normal.   ED Results / Procedures / Treatments   Labs (all labs ordered are listed, but only  abnormal results are displayed) Labs Reviewed  COMPREHENSIVE METABOLIC PANEL - Abnormal; Notable for the following components:      Result Value   Glucose, Bld 129 (*)    BUN 30 (*)    Creatinine, Ser 1.61 (*)    GFR, Estimated 36 (*)    All other components within normal limits  CBC WITH DIFFERENTIAL/PLATELET  URINALYSIS, ROUTINE W REFLEX MICROSCOPIC    EKG None  Radiology MR Cervical Spine Wo Contrast  Result Date: 04/08/2021 CLINICAL DATA:  Spinal cord injury, follow up EXAM: MRI CERVICAL SPINE WITHOUT CONTRAST TECHNIQUE: Multiplanar, multisequence MR imaging of the cervical spine was performed. No intravenous contrast was administered. COMPARISON:  None. FINDINGS: Alignment: Grade 1 retrolisthesis at C5-6 and C6-7. Grade 1 anterolisthesis at T1-2 and T2-3. Vertebrae: T1-2 discogenic edema. No acute fracture. No discitis-osteomyelitis. Cord: Faint hyperintense T2-weighted signal in the spinal cord at the T1-2 level Posterior Fossa, vertebral arteries, paraspinal tissues: Negative. Disc levels: C1-2: Unremarkable. C2-3: Moderate right facet hypertrophy. There is no spinal canal stenosis. No neural foraminal stenosis. C3-4: Moderate left facet hypertrophy. There is no spinal canal stenosis. Mild left neural foraminal stenosis. C4-5: Left asymmetric disc bulge with bilateral uncovertebral hypertrophy. Moderate spinal canal stenosis. Severe right and mild left neural foraminal stenosis. C5-6: Left asymmetric large disc bulge and superimposed left subarticular disc protrusion with bilateral uncovertebral hypertrophy. Severe spinal canal stenosis. Moderate right and severe left neural foraminal stenosis. C6-7: Small disc bulge with endplate spurring and central protrusion. Mild spinal canal stenosis. Moderate bilateral neural foraminal stenosis. C7-T1: Normal disc space and facet joints. There is no spinal canal stenosis. No neural foraminal stenosis. T1-2: Grade 1 anterolisthesis with disc  uncovering and endplate spurring. Severe spinal canal stenosis. Severe bilateral neural foraminal stenosis. T2-3: Grade 1 anterolisthesis with left subarticular disc protrusion indenting the ventral spinal cord. But, no spinal canal stenosis. IMPRESSION: 1. Severe spinal canal stenosis at C5-6 and T1-2 with mild hyperintense T2-weighted signal in  the spinal cord at the T1-2 level, likely myelomalacia. 2. Moderate C4-5 spinal canal stenosis with severe right and mild left neural foraminal stenosis. 3. Moderate right and severe left C5-6 neural foraminal stenosis. 4. Severe bilateral T1-2 neural foraminal stenosis. Electronically Signed   By: Deatra Robinson M.D.   On: 04/08/2021 19:33   MR Lumbar Spine W Wo Contrast  Result Date: 04/08/2021 CLINICAL DATA:  Myelopathy, acute or progressive. Paraspinal mass/tumor. Worsening chronic back pain. Urinary incontinence. EXAM: MRI LUMBAR SPINE WITHOUT AND WITH CONTRAST TECHNIQUE: Multiplanar and multiecho pulse sequences of the lumbar spine were obtained without and with intravenous contrast. CONTRAST:  6.38mL GADAVIST GADOBUTROL 1 MMOL/ML IV SOLN COMPARISON:  Lumbar spine MRI 05/05/2017. Report from a 04/06/2021 lumbar spine MRI from [facility] (images not available). FINDINGS: Segmentation:  Standard. Alignment:  Normal. Vertebrae: No fracture or suspicious marrow lesion. Degenerative endplate changes at L5-S1 including low level edema and enhancement. Conus medullaris and cauda equina: Conus extends to the L1-2 level and appears normal. There is a 2 mm nodular focus of enhancement within the dorsal aspect of the spinal canal at L3 along the cauda equina nerve roots likely corresponding to the abnormality described on the recent outside MRI and suspected to have been present on the noncontrast 2018 study as well. The cauda equina nerve roots are otherwise normal in appearance. Paraspinal and other soft tissues: Subcentimeter cyst in the left kidney. Disc levels: Mild  disc space narrowing at L1-2 and L2-3 and severe narrowing at L5-S1 with associated disc desiccation. T12-L1: Mild facet hypertrophy without disc herniation or stenosis. L1-2: Disc bulging, small central disc protrusion with annular fissure, and mild facet hypertrophy without stenosis, unchanged from 2018. L2-3: Progressive disc bulging and mild facet and ligamentum flavum hypertrophy result in mild left greater than right lateral recess stenosis and mild left neural foraminal stenosis without significant spinal stenosis. L3-4: Minimal disc bulging and mild facet and ligamentum flavum hypertrophy without stenosis, unchanged. L4-5: Mild disc bulging and mild-to-moderate facet and ligamentum flavum hypertrophy without stenosis, unchanged. L5-S1: Disc bulging, endplate spurring, severe disc space height loss, and mild facet hypertrophy result in mild bilateral lateral recess stenosis and moderate right and moderate to severe left neural foraminal stenosis without spinal stenosis, unchanged. IMPRESSION: 1. 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 2. Progressive disc degeneration at L2-3 since 2018 with mild lateral recess and left neural foraminal stenosis. 3. Unchanged advanced disc degeneration at L5-S1 with moderate to severe neural foraminal stenosis. 4. No spinal stenosis. Electronically Signed   By: Sebastian Ache M.D.   On: 04/08/2021 15:20    Procedures Procedures   Medications Ordered in ED Medications  gadobutrol (GADAVIST) 1 MMOL/ML injection 6.5 mL (6.5 mLs Intravenous Contrast Given 04/08/21 1457)  sodium chloride 0.9 % bolus 1,000 mL (0 mLs Intravenous Stopped 04/08/21 2115)    ED Course  I have reviewed the triage vital signs and the nursing notes.  Pertinent labs & imaging results that were available during my care of the patient were reviewed by me and considered in my medical decision making (see chart for details).  Clinical Course as of  04/08/21 2307  Wynelle Link Apr 08, 2021  1631 I spoke to Dr. Danielle Dess with neurosurgery.  He was unable to view the MRIs of her head, neck, and lumbar spine that were obtained at [facility] 2 days ago.  Based on the read of her neck he recommends that we  repeat the MRI at our facility.  This was discussed with the patient and she is amenable.  Also has a slight elevation in creatinine and a decrease in GFR.  Possible AKI.  Unsure of the cause.  Patient denies any recent changes in intake.  No medication changes.  No back or abdominal pain.  No urinary symptoms.  We will give a liter of IV fluids. [LJ]    Clinical Course User Index [LJ] Placido Sou, [name]   MDM Rules/Calculators/A&P                          Pt is a 60 y.o. female who presents to the emergency department with weakness, numbness, as well as upper back pain that has been progressive for the past 4 months.  Labs: CBC without abnormalities. CMP with a glucose of 129, BUN of 30, creatinine of 1.61, GFR 36.  Imaging: MRI of the cervical and lumbar spine with findings as noted above. CT scan of the thoracic spine with findings as noted above.  I, Placido Sou, [name], personally reviewed and evaluated these images and lab results as part of my medical decision-making.  Patient discussed with Dr. Danielle Dess with neurosurgery.  Recommended we also obtain an MRI of the cervical spine.  Findings as noted above.  Dr. Danielle Dess evaluated the patient at bedside and recommended an additional CT scan of the thoracic spine.  He states that patient can be discharged home after this is obtained and follow-up with them outpatient.  She was previously prescribed steroid medications that she has not started.  He recommended that she begin these.  Patient has obtained her CT scan.  Feel that she is stable for discharge at this time and she is agreeable.  We discussed return precautions.  Her questions were answered and she was amicable at the time of  discharge.  Note: Portions of this report may have been transcribed using voice recognition software. Every effort was made to ensure accuracy; however, inadvertent computerized transcription errors may be present.   Final Clinical Impression(s) / ED Diagnoses Final diagnoses:  Thoracic spondylosis with myelopathy  Cervical spondylosis with myelopathy    Rx / DC Orders ED Discharge Orders     None        Placido Sou, [name] 04/08/21 2307    Alvira Monday, MD 04/09/21 2204

## 2021-04-08 NOTE — Discharge Instructions (Addendum)
Please begin taking the steroid medications that were prescribed.  Please follow-up with Dr. Danielle Dess next week to schedule an appointment for follow-up.  If you develop any new or worsening symptoms please come back to the emergency department.  It was a pleasure to meet you.

## 2021-04-08 NOTE — Consult Note (Signed)
Reason for Consult: Thoracic myelopathy Referring Physician: Dr. Georgiana Spinner is an 60 y.o. female.  HPI: Jamie Walters is a 60 year old right-handed female who tells me that she has had back problems for about 8 years time.  Since about April of this year she has noted some increasing clumsiness and a number of falls that have occurred.  She is used to walking about 7 or 8 miles a day with her work but recently she has had to cut this down to walking about 2 and she has had the use a cane because she has been having some increasing stumbling.  She notes that just recently she had stubbed her foot and broke a toe secondary to her difficulty with balance.  She has had this worked up by an Scientist, research (life sciences) through [employer] and MRIs of the cervical spine and the lumbar spine were ordered.  The lumbar MRI demonstrates that the patient has some spondylitic degenerative changes at L5-S1 but otherwise a healthy looking spine the MRI of the cervical spine performed at [facility] demonstrates some significant stenosis with cord signal change at the T1-T2 level.  The patient was advised to go to the nearest emergency room by her orthopedist and she came to [facility] this morning.  A lumbar MRI was repeated and this essentially recapitulates the findings that were noted on the report.  I was contacted regarding this process and I noted that the MRI of the cervical spine needs to be repeated as this is where the pathology is in the upper thoracic spine that has been noted.  Per the physicians is been noted that she has had good strength and no focal deficits could be noted on her exam here.  The MRI performed here demonstrates that there is a significant canal stenosis in the cervical spine at C4-5 and C5-6 in addition there is advanced spinal stenosis with a spondylolisthesis at T1-T2 and to a lesser extent at T2-T3 there is significant overgrowth of the posterior longitudinal ligament in addition to bulging  of the disc creating a significant stenosis at this level.  The patient notes that she has not had any significant pain between her shoulder blades although she does admit to occasional Lhermitte's phenomenon with some radiating electrical sensations.  Past Medical History:  Diagnosis Date   Allergy    Arthritis    Depression    Hypertension     Past Surgical History:  Procedure Laterality Date   TUBAL LIGATION      Family History  Problem Relation Age of Onset   Hypertension Mother    Hyperlipidemia Mother    Stroke Mother    COPD Father    Alcohol abuse Father    Arthritis Father    Cancer Father        bone cancer   Hypertension Sister    COPD Brother    COPD Brother    Mental illness Son        PTSD; [place] war veteran    Social History:  reports that she has been smoking cigarettes. She has a 10.00 pack-year smoking history. She has never used smokeless tobacco. She reports that she does not drink alcohol and does not use drugs.  Allergies: No Known Allergies  Medications: I have reviewed the patient's current medications. Meds are noted  Results for orders placed or performed during the hospital encounter of 04/08/21 (from the past 48 hour(s))  CBC with Differential     Status: None  Collection Time: 04/08/21 11:49 AM  Result Value Ref Range   WBC 9.2 4.0 - 10.5 K/uL   RBC 4.03 3.87 - 5.11 MIL/uL   Hemoglobin 12.6 12.0 - 15.0 g/dL   HCT 90.3 00.9 - 23.3 %   MCV 94.3 80.0 - 100.0 fL   MCH 31.3 26.0 - 34.0 pg   MCHC 33.2 30.0 - 36.0 g/dL   RDW 00.7 62.2 - 63.3 %   Platelets 278 150 - 400 K/uL   nRBC 0.0 0.0 - 0.2 %   Neutrophils Relative % 55 %   Neutro Abs 5.0 1.7 - 7.7 K/uL   Lymphocytes Relative 33 %   Lymphs Abs 3.0 0.7 - 4.0 K/uL   Monocytes Relative 10 %   Monocytes Absolute 1.0 0.1 - 1.0 K/uL   Eosinophils Relative 1 %   Eosinophils Absolute 0.1 0.0 - 0.5 K/uL   Basophils Relative 0 %   Basophils Absolute 0.0 0.0 - 0.1 K/uL   Immature  Granulocytes 1 %   Abs Immature Granulocytes 0.05 0.00 - 0.07 K/uL    Comment: Performed at [facility], [address], [place], [place] [address]  Comprehensive metabolic panel     Status: Abnormal   Collection Time: 04/08/21 11:49 AM  Result Value Ref Range   Sodium 136 135 - 145 mmol/L   Potassium 3.5 3.5 - 5.1 mmol/L   Chloride 102 98 - 111 mmol/L   CO2 25 22 - 32 mmol/L   Glucose, Bld 129 (H) 70 - 99 mg/dL    Comment: Glucose reference range applies only to samples taken after fasting for at least 8 hours.   BUN 30 (H) 8 - 23 mg/dL   Creatinine, Ser 2.56 (H) 0.44 - 1.00 mg/dL   Calcium 9.2 8.9 - 38.9 mg/dL   Total Protein 6.7 6.5 - 8.1 g/dL   Albumin 3.7 3.5 - 5.0 g/dL   AST 26 15 - 41 U/L   ALT 25 0 - 44 U/L   Alkaline Phosphatase 77 38 - 126 U/L   Total Bilirubin 0.4 0.3 - 1.2 mg/dL   GFR, Estimated 36 (L) >60 mL/min    Comment: (NOTE) Calculated using the CKD-EPI Creatinine Equation (2021)    Anion gap 9 5 - 15    Comment: Performed at [facility], [address], [place], [place] [address]    MR Cervical Spine Wo Contrast  Result Date: 04/08/2021 CLINICAL DATA:  Spinal cord injury, follow up EXAM: MRI CERVICAL SPINE WITHOUT CONTRAST TECHNIQUE: Multiplanar, multisequence MR imaging of the cervical spine was performed. No intravenous contrast was administered. COMPARISON:  None. FINDINGS: Alignment: Grade 1 retrolisthesis at C5-6 and C6-7. Grade 1 anterolisthesis at T1-2 and T2-3. Vertebrae: T1-2 discogenic edema. No acute fracture. No discitis-osteomyelitis. Cord: Faint hyperintense T2-weighted signal in the spinal cord at the T1-2 level Posterior Fossa, vertebral arteries, paraspinal tissues: Negative. Disc levels: C1-2: Unremarkable. C2-3: Moderate right facet hypertrophy. There is no spinal canal stenosis. No neural foraminal stenosis. C3-4: Moderate left facet hypertrophy. There is no spinal canal stenosis. Mild left neural foraminal stenosis. C4-5: Left  asymmetric disc bulge with bilateral uncovertebral hypertrophy. Moderate spinal canal stenosis. Severe right and mild left neural foraminal stenosis. C5-6: Left asymmetric large disc bulge and superimposed left subarticular disc protrusion with bilateral uncovertebral hypertrophy. Severe spinal canal stenosis. Moderate right and severe left neural foraminal stenosis. C6-7: Small disc bulge with endplate spurring and central protrusion. Mild spinal canal stenosis. Moderate bilateral neural foraminal  stenosis. C7-T1: Normal disc space and facet joints. There is no spinal canal stenosis. No neural foraminal stenosis. T1-2: Grade 1 anterolisthesis with disc uncovering and endplate spurring. Severe spinal canal stenosis. Severe bilateral neural foraminal stenosis. T2-3: Grade 1 anterolisthesis with left subarticular disc protrusion indenting the ventral spinal cord. But, no spinal canal stenosis. IMPRESSION: 1. 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 2. Moderate C4-5 spinal canal stenosis with severe right and mild left neural foraminal stenosis. 3. Moderate right and severe left C5-6 neural foraminal stenosis. 4. Severe bilateral T1-2 neural foraminal stenosis. Electronically Signed   By: Deatra Robinson M.D.   On: 04/08/2021 19:33   MR Lumbar Spine W Wo Contrast  Result Date: 04/08/2021 CLINICAL DATA:  Myelopathy, acute or progressive. Paraspinal mass/tumor. Worsening chronic back pain. Urinary incontinence. EXAM: MRI LUMBAR SPINE WITHOUT AND WITH CONTRAST TECHNIQUE: Multiplanar and multiecho pulse sequences of the lumbar spine were obtained without and with intravenous contrast. CONTRAST:  6.67mL GADAVIST GADOBUTROL 1 MMOL/ML IV SOLN COMPARISON:  Lumbar spine MRI 05/05/2017. Report from a 04/06/2021 lumbar spine MRI from [facility] (images not available). FINDINGS: Segmentation:  Standard. Alignment:  Normal.  Vertebrae: No fracture or suspicious marrow lesion. Degenerative endplate changes at L5-S1 including low level edema and enhancement. Conus medullaris and cauda equina: Conus extends to the L1-2 level and appears normal. There is a 2 mm nodular focus of enhancement within the dorsal aspect of the spinal canal at L3 along the cauda equina nerve roots likely corresponding to the abnormality described on the recent outside MRI and suspected to have been present on the noncontrast 2018 study as well. The cauda equina nerve roots are otherwise normal in appearance. Paraspinal and other soft tissues: Subcentimeter cyst in the left kidney. Disc levels: Mild disc space narrowing at L1-2 and L2-3 and severe narrowing at L5-S1 with associated disc desiccation. T12-L1: Mild facet hypertrophy without disc herniation or stenosis. L1-2: Disc bulging, small central disc protrusion with annular fissure, and mild facet hypertrophy without stenosis, unchanged from 2018. L2-3: Progressive disc bulging and mild facet and ligamentum flavum hypertrophy result in mild left greater than right lateral recess stenosis and mild left neural foraminal stenosis without significant spinal stenosis. L3-4: Minimal disc bulging and mild facet and ligamentum flavum hypertrophy without stenosis, unchanged. L4-5: Mild disc bulging and mild-to-moderate facet and ligamentum flavum hypertrophy without stenosis, unchanged. L5-S1: Disc bulging, endplate spurring, severe disc space height loss, and mild facet hypertrophy result in mild bilateral lateral recess stenosis and moderate right and moderate to severe left neural foraminal stenosis without spinal stenosis, unchanged. IMPRESSION: 1. 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 2. Progressive disc degeneration at L2-3 since 2018 with mild lateral recess and left neural foraminal stenosis. 3. Unchanged advanced disc degeneration at L5-S1 with moderate to  severe neural foraminal stenosis. 4. No spinal stenosis. Electronically Signed   By: Sebastian Ache M.D.   On: 04/08/2021 15:20    Review of Systems  Constitutional:  Positive for activity change.  HENT: Negative.    Eyes: Negative.   Respiratory: Negative.    Cardiovascular: Negative.   Gastrointestinal: Negative.   Endocrine: Negative.   Genitourinary: Negative.   Musculoskeletal:  Positive for back pain and gait problem.  Allergic/Immunologic: Negative.   Neurological:  Positive for weakness and numbness.  Hematological: Negative.   Psychiatric/Behavioral: Negative.  Blood pressure 117/62, pulse 73, temperature 98.5 F (36.9 C), temperature source Oral, resp. rate 17, height 5\' 2"  (1.575 m), weight 65.3 kg, SpO2 100 %. Physical Exam Constitutional:      Appearance: Normal appearance. She is normal weight.  HENT:     Right Ear: Tympanic membrane normal.     Left Ear: Tympanic membrane normal.     Nose: Nose normal.     Mouth/Throat:     Mouth: Mucous membranes are moist.  Eyes:     Extraocular Movements: Extraocular movements intact.     Conjunctiva/sclera: Conjunctivae normal.     Pupils: Pupils are equal, round, and reactive to light.  Cardiovascular:     Rate and Rhythm: Normal rate and regular rhythm.  Pulmonary:     Effort: Pulmonary effort is normal.     Breath sounds: Normal breath sounds.  Abdominal:     General: Abdomen is flat. Bowel sounds are normal.     Palpations: Abdomen is soft.  Musculoskeletal:        General: Normal range of motion.     Cervical back: Normal range of motion and neck supple.  Skin:    General: Skin is warm and dry.     Capillary Refill: Capillary refill takes less than 2 seconds.  Neurological:     Mental Status: She is alert.     Comments: Patient's cranial nerve examination is normal upper extremity strength is good in the deltoids biceps triceps grips and intrinsics rapid alternating movements are somewhat slowed and testing  heel-to-shin movement in the lower extremities she has some spasticity noted.  Tone is slightly increased in the lower extremities though strength is good.  Deep tendon reflexes are 3+ in the patellae and the Achilles upgoing Babinski's are noted in both lower extremities upper extremity reflexes reveal 1+ biceps and triceps reflexes bilaterally.  Hoffmann's test is negative.    Assessment/Plan: Thoracic and cervical spondylosis with myelopathy.  Plan: Patient will need a two-stage decompression posteriorly in the thoracic spine and anteriorly in the cervical spine.  The more critical stenosis occurs in the thoracic spine and will need to be addressed through a posterior operation from C7-T2.  The anterior operation will likely need a two-level anterior decompression at C4-5 and C5-6.  A CT scan of the thoracic spine should be performed now so that we may have a CT with robotic protocol to perform the posterior decompression and stabilization.  I noted that she the patient will repeat require a period of time about 3 months to be out of work to complete both operations and recover adequately.  I answered the patient's questions and she is eager to get something definitive done as she feels that her ability to walk has been deteriorating progressively now for several months time.  Shary KeyHenry J Jaydenn Boccio 04/08/2021, 10:32 PM

## 2021-04-08 NOTE — ED Notes (Addendum)
Patient transported to CT 

## 2021-04-08 NOTE — ED Provider Notes (Signed)
Emergency Medicine Provider Triage Evaluation Note  Jamie Walters , a 60 y.o. female  was evaluated in triage.  Pt complains of chronic back pain which is worsening.  Had MRIs recently which showed concern for compression.  Patient's "back specialist" told her to come to the ER for evaluation and possible neurosurgical consultation.  She reports urinary incontinence, but then states she knows that she needs to urinate, but cannot make it to the bathroom in time.  No bowel abnormalities.  Review of Systems  Positive: Back pain Negative: fever  Physical Exam  BP (!) 155/70   Pulse 83   Temp 98.5 F (36.9 C) (Oral)   Resp 14   SpO2 100%  Gen:   Awake, no distress   Resp:  Normal effort  MSK:   Moves extremities without difficulty  Other:  No saddle anesthesia  Medical Decision Making  Medically screening exam initiated at 11:43 AM.  Appropriate orders placed.  [name] was informed that the remainder of the evaluation will be completed by another provider, this initial triage assessment does not replace that evaluation, and the importance of remaining in the ED until their evaluation is complete.  MRI from OSH shows abnormality of L spine, recommends MRI with contrast.    Alveria Apley, [name] 04/08/21 1144    [name], MD 04/10/21 [phone]

## 2021-04-08 NOTE — ED Triage Notes (Signed)
Pt reports frequent falls.  Had MRI on Friday and received call today that she has a compressed spinal cord.  Reports lower back pain, R shoulder blade pain, and urinary incontinence.

## 2021-04-10 ENCOUNTER — Other Ambulatory Visit: Payer: Self-pay | Admitting: Neurological Surgery

## 2021-04-13 ENCOUNTER — Encounter (HOSPITAL_COMMUNITY): Payer: Self-pay | Admitting: Neurological Surgery

## 2021-04-13 NOTE — Progress Notes (Signed)
Spoke with pt's daughter, Andrey Campanile for pre-op call. DPR on file. Andrey Campanile states pt does not have a cardiac history and is not diabetic. Pt is treated for HTN.   Pt is at work today and does not get off until after 3 PM. Will need Covid test on arrival.   Pt's PCP is Porfirio Oar, PA

## 2021-04-16 ENCOUNTER — Inpatient Hospital Stay (HOSPITAL_COMMUNITY)
Admission: RE | Admit: 2021-04-16 | Discharge: 2021-04-16 | Disposition: A | Discharge status: Home / Self Care | Payer: 59 | Source: Ambulatory Visit | Attending: Neurological Surgery | Admitting: Neurological Surgery

## 2021-04-16 ENCOUNTER — Inpatient Hospital Stay (HOSPITAL_COMMUNITY): Payer: 59 | Admitting: Anesthesiology

## 2021-04-16 ENCOUNTER — Other Ambulatory Visit (HOSPITAL_COMMUNITY): Payer: Self-pay | Admitting: Neurological Surgery

## 2021-04-16 ENCOUNTER — Other Ambulatory Visit: Payer: Self-pay

## 2021-04-16 ENCOUNTER — Encounter (HOSPITAL_COMMUNITY): Payer: Self-pay | Admitting: Neurological Surgery

## 2021-04-16 ENCOUNTER — Encounter (HOSPITAL_COMMUNITY)
Admission: RE | Disposition: A | Discharge status: Home / Self Care | Payer: Self-pay | Source: Ambulatory Visit | Attending: Neurological Surgery

## 2021-04-16 ENCOUNTER — Inpatient Hospital Stay (HOSPITAL_COMMUNITY)
Admission: RE | Admit: 2021-04-16 | Discharge: 2021-04-17 | DRG: 460 | Disposition: A | Discharge status: Home / Self Care | Payer: 59 | Source: Ambulatory Visit | Attending: Neurological Surgery | Admitting: Neurological Surgery

## 2021-04-16 ENCOUNTER — Inpatient Hospital Stay (HOSPITAL_COMMUNITY): Payer: 59

## 2021-04-16 DIAGNOSIS — Z20822 Contact with and (suspected) exposure to covid-19: Secondary | ICD-10-CM | POA: Diagnosis present

## 2021-04-16 DIAGNOSIS — M4314 Spondylolisthesis, thoracic region: Secondary | ICD-10-CM | POA: Diagnosis present

## 2021-04-16 DIAGNOSIS — M4712 Other spondylosis with myelopathy, cervical region: Secondary | ICD-10-CM | POA: Diagnosis present

## 2021-04-16 DIAGNOSIS — F32A Depression, unspecified: Secondary | ICD-10-CM | POA: Diagnosis present

## 2021-04-16 DIAGNOSIS — Z419 Encounter for procedure for purposes other than remedying health state, unspecified: Secondary | ICD-10-CM

## 2021-04-16 DIAGNOSIS — M48061 Spinal stenosis, lumbar region without neurogenic claudication: Secondary | ICD-10-CM | POA: Diagnosis present

## 2021-04-16 DIAGNOSIS — F17210 Nicotine dependence, cigarettes, uncomplicated: Secondary | ICD-10-CM | POA: Diagnosis present

## 2021-04-16 DIAGNOSIS — Z8249 Family history of ischemic heart disease and other diseases of the circulatory system: Secondary | ICD-10-CM | POA: Diagnosis not present

## 2021-04-16 DIAGNOSIS — I10 Essential (primary) hypertension: Secondary | ICD-10-CM | POA: Diagnosis present

## 2021-04-16 DIAGNOSIS — M4714 Other spondylosis with myelopathy, thoracic region: Secondary | ICD-10-CM | POA: Diagnosis present

## 2021-04-16 DIAGNOSIS — M4802 Spinal stenosis, cervical region: Secondary | ICD-10-CM | POA: Diagnosis present

## 2021-04-16 DIAGNOSIS — M5137 Other intervertebral disc degeneration, lumbosacral region: Secondary | ICD-10-CM

## 2021-04-16 HISTORY — PX: APPLICATION OF ROBOTIC ASSISTANCE FOR SPINAL PROCEDURE: SHX6753

## 2021-04-16 HISTORY — DX: Headache, unspecified: R51.9

## 2021-04-16 LAB — SARS CORONAVIRUS 2 BY RT PCR (HOSPITAL ORDER, PERFORMED IN ~~LOC~~ HOSPITAL LAB): SARS Coronavirus 2: NEGATIVE

## 2021-04-16 LAB — TYPE AND SCREEN
ABO/RH(D): O POS
Antibody Screen: NEGATIVE

## 2021-04-16 LAB — SURGICAL PCR SCREEN
MRSA, PCR: NEGATIVE
Staphylococcus aureus: NEGATIVE

## 2021-04-16 LAB — ABO/RH: ABO/RH(D): O POS

## 2021-04-16 SURGERY — POSTERIOR LUMBAR FUSION 2 LEVEL
Anesthesia: General | Site: Back

## 2021-04-16 MED ORDER — OXYCODONE HCL 5 MG PO TABS: 5.0000 mg | ORAL_TABLET | Freq: Once | ORAL | Status: DC | PRN

## 2021-04-16 MED ORDER — ROCURONIUM 10MG/ML (10ML) SYRINGE FOR MEDFUSION PUMP - OPTIME
INTRAVENOUS | Status: DC | PRN
Start: 2021-04-16 — End: 2021-04-16
  Administered 2021-04-16: 100 mg via INTRAVENOUS
  Administered 2021-04-16: 20 mg via INTRAVENOUS
  Administered 2021-04-16: 10 mg via INTRAVENOUS

## 2021-04-16 MED ORDER — MEPERIDINE HCL 25 MG/ML IJ SOLN: 6.2500 mg | INTRAMUSCULAR | Status: DC | PRN

## 2021-04-16 MED ORDER — ONDANSETRON HCL 4 MG/2ML IJ SOLN: 4.0000 mg | Freq: Four times a day (QID) | INTRAMUSCULAR | Status: DC | PRN

## 2021-04-16 MED ORDER — METHOCARBAMOL 500 MG PO TABS
ORAL_TABLET | ORAL | Status: AC
  Filled 2021-04-16: qty 1

## 2021-04-16 MED ORDER — ACETAMINOPHEN 160 MG/5ML PO SOLN: 325.0000 mg | ORAL | Status: DC | PRN

## 2021-04-16 MED ORDER — HYDROMORPHONE HCL 1 MG/ML IJ SOLN
INTRAMUSCULAR | Status: AC
  Administered 2021-04-16: 0.5 mg via INTRAVENOUS
  Filled 2021-04-16: qty 1

## 2021-04-16 MED ORDER — PRAMIPEXOLE DIHYDROCHLORIDE 0.25 MG PO TABS
0.3750 mg | ORAL_TABLET | Freq: Every day | ORAL | Status: DC
  Administered 2021-04-16: 0.375 mg via ORAL
  Filled 2021-04-16 (×2): qty 1

## 2021-04-16 MED ORDER — METHOCARBAMOL 500 MG PO TABS
500.0000 mg | ORAL_TABLET | Freq: Four times a day (QID) | ORAL | Status: DC | PRN
  Administered 2021-04-16 – 2021-04-17 (×3): 500 mg via ORAL
  Filled 2021-04-16 (×2): qty 1

## 2021-04-16 MED ORDER — ACETAMINOPHEN 325 MG PO TABS: 325.0000 mg | ORAL_TABLET | ORAL | Status: DC | PRN

## 2021-04-16 MED ORDER — METOPROLOL SUCCINATE ER 25 MG PO TB24
ORAL_TABLET | ORAL | Status: AC
  Filled 2021-04-16: qty 1

## 2021-04-16 MED ORDER — FENTANYL CITRATE (PF) 100 MCG/2ML IJ SOLN: 25.0000 ug | INTRAMUSCULAR | Status: DC | PRN

## 2021-04-16 MED ORDER — LIDOCAINE HCL (CARDIAC) PF 100 MG/5ML IV SOSY
PREFILLED_SYRINGE | INTRAVENOUS | Status: DC | PRN
  Administered 2021-04-16: 100 mg via INTRAVENOUS

## 2021-04-16 MED ORDER — PHENOL 1.4 % MT LIQD: 1.0000 | OROMUCOSAL | Status: DC | PRN

## 2021-04-16 MED ORDER — ORAL CARE MOUTH RINSE: 15.0000 mL | Freq: Once | OROMUCOSAL | Status: AC

## 2021-04-16 MED ORDER — 0.9 % SODIUM CHLORIDE (POUR BTL) OPTIME
TOPICAL | Status: DC | PRN
  Administered 2021-04-16: 1000 mL

## 2021-04-16 MED ORDER — METHOCARBAMOL 1000 MG/10ML IJ SOLN
500.0000 mg | Freq: Four times a day (QID) | INTRAVENOUS | Status: DC | PRN
  Filled 2021-04-16: qty 5

## 2021-04-16 MED ORDER — PHENYLEPHRINE HCL-NACL 10-0.9 MG/250ML-% IV SOLN
INTRAVENOUS | Status: DC | PRN
  Administered 2021-04-16: 50 ug/min via INTRAVENOUS

## 2021-04-16 MED ORDER — ALUM & MAG HYDROXIDE-SIMETH 200-200-20 MG/5ML PO SUSP: 30.0000 mL | Freq: Four times a day (QID) | ORAL | Status: DC | PRN

## 2021-04-16 MED ORDER — ONDANSETRON HCL 4 MG PO TABS: 4.0000 mg | ORAL_TABLET | Freq: Four times a day (QID) | ORAL | Status: DC | PRN

## 2021-04-16 MED ORDER — BUPROPION HCL ER (XL) 300 MG PO TB24
450.0000 mg | ORAL_TABLET | Freq: Every day | ORAL | Status: DC
  Filled 2021-04-16: qty 1

## 2021-04-16 MED ORDER — SENNA 8.6 MG PO TABS
1.0000 | ORAL_TABLET | Freq: Two times a day (BID) | ORAL | Status: DC
  Administered 2021-04-16: 8.6 mg via ORAL
  Filled 2021-04-16: qty 1

## 2021-04-16 MED ORDER — THROMBIN 5000 UNITS EX SOLR
OROMUCOSAL | Status: DC | PRN
  Administered 2021-04-16: 5 mL via TOPICAL

## 2021-04-16 MED ORDER — METOPROLOL SUCCINATE ER 100 MG PO TB24: 100.0000 mg | ORAL_TABLET | Freq: Every day | ORAL | Status: DC

## 2021-04-16 MED ORDER — THROMBIN 20000 UNITS EX SOLR
CUTANEOUS | Status: DC | PRN
  Administered 2021-04-16: 20 mL via TOPICAL

## 2021-04-16 MED ORDER — THROMBIN 20000 UNITS EX SOLR
CUTANEOUS | Status: AC
  Filled 2021-04-16: qty 20000

## 2021-04-16 MED ORDER — ACETAMINOPHEN 10 MG/ML IV SOLN
INTRAVENOUS | Status: DC | PRN
  Administered 2021-04-16: 1000 mg via INTRAVENOUS

## 2021-04-16 MED ORDER — MENTHOL 3 MG MT LOZG: 1.0000 | LOZENGE | OROMUCOSAL | Status: DC | PRN

## 2021-04-16 MED ORDER — LISINOPRIL 20 MG PO TABS: 40.0000 mg | ORAL_TABLET | Freq: Every day | ORAL | Status: DC

## 2021-04-16 MED ORDER — POLYETHYLENE GLYCOL 3350 17 G PO PACK: 17.0000 g | PACK | Freq: Every day | ORAL | Status: DC | PRN

## 2021-04-16 MED ORDER — CYCLOBENZAPRINE HCL 10 MG PO TABS: 10.0000 mg | ORAL_TABLET | Freq: Every evening | ORAL | Status: DC | PRN

## 2021-04-16 MED ORDER — SODIUM CHLORIDE 0.9% FLUSH: 3.0000 mL | INTRAVENOUS | Status: DC | PRN

## 2021-04-16 MED ORDER — ACETAMINOPHEN 325 MG PO TABS: 650.0000 mg | ORAL_TABLET | ORAL | Status: DC | PRN

## 2021-04-16 MED ORDER — HYDROCHLOROTHIAZIDE 25 MG PO TABS: 25.0000 mg | ORAL_TABLET | Freq: Every day | ORAL | Status: DC

## 2021-04-16 MED ORDER — BUPIVACAINE HCL (PF) 0.5 % IJ SOLN
INTRAMUSCULAR | Status: AC
  Filled 2021-04-16: qty 30

## 2021-04-16 MED ORDER — LACTATED RINGERS IV SOLN: INTRAVENOUS | Status: DC

## 2021-04-16 MED ORDER — MIDAZOLAM HCL 2 MG/2ML IJ SOLN
INTRAMUSCULAR | Status: AC
  Filled 2021-04-16: qty 2

## 2021-04-16 MED ORDER — CEFAZOLIN SODIUM-DEXTROSE 2-4 GM/100ML-% IV SOLN
INTRAVENOUS | Status: AC
  Filled 2021-04-16: qty 100

## 2021-04-16 MED ORDER — LIDOCAINE-EPINEPHRINE 1 %-1:100000 IJ SOLN
INTRAMUSCULAR | Status: DC | PRN
  Administered 2021-04-16: 5 mL

## 2021-04-16 MED ORDER — METOPROLOL SUCCINATE ER 25 MG PO TB24
100.0000 mg | ORAL_TABLET | Freq: Once | ORAL | Status: AC
  Administered 2021-04-16: 100 mg via ORAL
  Filled 2021-04-16: qty 4

## 2021-04-16 MED ORDER — CHLORHEXIDINE GLUCONATE CLOTH 2 % EX PADS: 6.0000 | MEDICATED_PAD | Freq: Once | CUTANEOUS | Status: DC

## 2021-04-16 MED ORDER — LIDOCAINE-EPINEPHRINE 1 %-1:100000 IJ SOLN
INTRAMUSCULAR | Status: AC
  Filled 2021-04-16: qty 1

## 2021-04-16 MED ORDER — FLEET ENEMA 7-19 GM/118ML RE ENEM: 1.0000 | ENEMA | Freq: Once | RECTAL | Status: DC | PRN

## 2021-04-16 MED ORDER — CHLORHEXIDINE GLUCONATE 0.12 % MT SOLN: 15.0000 mL | Freq: Once | OROMUCOSAL | Status: AC

## 2021-04-16 MED ORDER — BISACODYL 10 MG RE SUPP: 10.0000 mg | Freq: Every day | RECTAL | Status: DC | PRN

## 2021-04-16 MED ORDER — MIDAZOLAM HCL 2 MG/2ML IJ SOLN
INTRAMUSCULAR | Status: DC | PRN
  Administered 2021-04-16: 2 mg via INTRAVENOUS

## 2021-04-16 MED ORDER — ONDANSETRON HCL 4 MG PO TABS: 4.0000 mg | ORAL_TABLET | Freq: Three times a day (TID) | ORAL | Status: DC | PRN

## 2021-04-16 MED ORDER — SUFENTANIL CITRATE 50 MCG/ML IV SOLN
INTRAVENOUS | Status: AC
  Filled 2021-04-16: qty 1

## 2021-04-16 MED ORDER — EPHEDRINE SULFATE-NACL 50-0.9 MG/10ML-% IV SOSY
PREFILLED_SYRINGE | INTRAVENOUS | Status: DC | PRN
  Administered 2021-04-16: 10 mg via INTRAVENOUS

## 2021-04-16 MED ORDER — HYDROXYZINE HCL 25 MG PO TABS
25.0000 mg | ORAL_TABLET | Freq: Every day | ORAL | Status: DC
  Administered 2021-04-16: 25 mg via ORAL
  Filled 2021-04-16: qty 1

## 2021-04-16 MED ORDER — HYDROCODONE-ACETAMINOPHEN 10-325 MG PO TABS: 1.0000 | ORAL_TABLET | Freq: Four times a day (QID) | ORAL | Status: DC | PRN

## 2021-04-16 MED ORDER — DEXAMETHASONE SODIUM PHOSPHATE 10 MG/ML IJ SOLN
INTRAMUSCULAR | Status: DC | PRN
  Administered 2021-04-16: 10 mg via INTRAVENOUS

## 2021-04-16 MED ORDER — LACTATED RINGERS IV SOLN: INTRAVENOUS | Status: DC | PRN

## 2021-04-16 MED ORDER — ACETAMINOPHEN 650 MG RE SUPP: 650.0000 mg | RECTAL | Status: DC | PRN

## 2021-04-16 MED ORDER — HYDROMORPHONE HCL 1 MG/ML IJ SOLN
0.5000 mg | INTRAMUSCULAR | Status: AC | PRN
  Administered 2021-04-16 (×2): 0.5 mg via INTRAVENOUS

## 2021-04-16 MED ORDER — BUPIVACAINE HCL (PF) 0.25 % IJ SOLN
INTRAMUSCULAR | Status: AC
  Filled 2021-04-16: qty 30

## 2021-04-16 MED ORDER — OXYCODONE HCL 5 MG/5ML PO SOLN: 5.0000 mg | Freq: Once | ORAL | Status: DC | PRN

## 2021-04-16 MED ORDER — MORPHINE SULFATE (PF) 2 MG/ML IV SOLN: 2.0000 mg | INTRAVENOUS | Status: DC | PRN

## 2021-04-16 MED ORDER — ONDANSETRON HCL 4 MG/2ML IJ SOLN: 4.0000 mg | Freq: Once | INTRAMUSCULAR | Status: DC | PRN

## 2021-04-16 MED ORDER — THROMBIN 5000 UNITS EX SOLR
CUTANEOUS | Status: AC
  Filled 2021-04-16: qty 5000

## 2021-04-16 MED ORDER — BUPIVACAINE HCL (PF) 0.25 % IJ SOLN
INTRAMUSCULAR | Status: DC | PRN
  Administered 2021-04-16: 5 mL
  Administered 2021-04-16: 25 mL

## 2021-04-16 MED ORDER — PROPOFOL 10 MG/ML IV BOLUS
INTRAVENOUS | Status: DC | PRN
  Administered 2021-04-16: 150 mg via INTRAVENOUS

## 2021-04-16 MED ORDER — ONDANSETRON HCL 4 MG/2ML IJ SOLN
INTRAMUSCULAR | Status: DC | PRN
  Administered 2021-04-16: 4 mg via INTRAVENOUS

## 2021-04-16 MED ORDER — SUFENTANIL CITRATE 50 MCG/ML IV SOLN
INTRAVENOUS | Status: DC | PRN
  Administered 2021-04-16 (×2): 10 ug via INTRAVENOUS

## 2021-04-16 MED ORDER — PHENYLEPHRINE 40 MCG/ML (10ML) SYRINGE FOR IV PUSH (FOR BLOOD PRESSURE SUPPORT)
PREFILLED_SYRINGE | INTRAVENOUS | Status: DC | PRN
  Administered 2021-04-16: 120 ug via INTRAVENOUS

## 2021-04-16 MED ORDER — SODIUM CHLORIDE 0.9% FLUSH: 3.0000 mL | Freq: Two times a day (BID) | INTRAVENOUS | Status: DC

## 2021-04-16 MED ORDER — OXYCODONE-ACETAMINOPHEN 5-325 MG PO TABS
1.0000 | ORAL_TABLET | ORAL | Status: DC | PRN
  Administered 2021-04-16: 1 via ORAL
  Administered 2021-04-17 (×2): 2 via ORAL
  Filled 2021-04-16: qty 1
  Filled 2021-04-16 (×2): qty 2

## 2021-04-16 MED ORDER — CEFAZOLIN SODIUM-DEXTROSE 2-4 GM/100ML-% IV SOLN
2.0000 g | Freq: Three times a day (TID) | INTRAVENOUS | Status: AC
  Administered 2021-04-16 – 2021-04-17 (×2): 2 g via INTRAVENOUS
  Filled 2021-04-16 (×2): qty 100

## 2021-04-16 MED ORDER — CHLORHEXIDINE GLUCONATE 0.12 % MT SOLN
OROMUCOSAL | Status: AC
  Administered 2021-04-16: 15 mL via OROMUCOSAL
  Filled 2021-04-16: qty 15

## 2021-04-16 MED ORDER — CEFAZOLIN SODIUM-DEXTROSE 2-4 GM/100ML-% IV SOLN
2.0000 g | INTRAVENOUS | Status: AC
  Administered 2021-04-16: 2 g via INTRAVENOUS

## 2021-04-16 MED ORDER — SODIUM CHLORIDE 0.9 % IV SOLN: 250.0000 mL | INTRAVENOUS | Status: DC

## 2021-04-16 MED ORDER — ATORVASTATIN CALCIUM 40 MG PO TABS
40.0000 mg | ORAL_TABLET | Freq: Every evening | ORAL | Status: DC
  Administered 2021-04-16: 40 mg via ORAL
  Filled 2021-04-16: qty 1

## 2021-04-16 MED ORDER — SUGAMMADEX SODIUM 200 MG/2ML IV SOLN
INTRAVENOUS | Status: DC | PRN
  Administered 2021-04-16: 200 mg via INTRAVENOUS

## 2021-04-16 MED ORDER — DOCUSATE SODIUM 100 MG PO CAPS
100.0000 mg | ORAL_CAPSULE | Freq: Two times a day (BID) | ORAL | Status: DC
  Administered 2021-04-16: 100 mg via ORAL
  Filled 2021-04-16: qty 1

## 2021-04-16 SURGICAL SUPPLY — 77 items
ADH SKN CLS APL DERMABOND .7 (GAUZE/BANDAGES/DRESSINGS) ×1
APL SRG 60D 8 XTD TIP BNDBL (TIP)
BAG COUNTER SPONGE SURGICOUNT (BAG) ×4 IMPLANT
BAG SPNG CNTER NS LX DISP (BAG) ×2
BASKET BONE COLLECTION (BASKET) ×2 IMPLANT
BIT DRILL LONG 3.0X30 (BIT) ×4 IMPLANT
BIT DRILL LONG 3X80 (BIT) IMPLANT
BIT DRILL LONG 4X80 (BIT) IMPLANT
BIT DRILL SHORT 3.0X30 (BIT) IMPLANT
BIT DRILL SHORT 3X80 (BIT) IMPLANT
BLADE CLIPPER SURG (BLADE) ×2 IMPLANT
BLADE SURG 11 STRL SS (BLADE) IMPLANT
BUR MATCHSTICK NEURO 3.0 LAGG (BURR) ×2 IMPLANT
CANISTER SUCT 3000ML PPV (MISCELLANEOUS) ×2 IMPLANT
CNTNR URN SCR LID CUP LEK RST (MISCELLANEOUS) ×1 IMPLANT
CONT SPEC 4OZ STRL OR WHT (MISCELLANEOUS) ×2
COVER BACK TABLE 60X90IN (DRAPES) ×2 IMPLANT
DECANTER SPIKE VIAL GLASS SM (MISCELLANEOUS) ×2 IMPLANT
DERMABOND ADVANCED (GAUZE/BANDAGES/DRESSINGS) ×1
DERMABOND ADVANCED .7 DNX12 (GAUZE/BANDAGES/DRESSINGS) ×1 IMPLANT
DEVICE DISSECT PLASMABLAD 3.0S (MISCELLANEOUS) ×1 IMPLANT
DRAPE C-ARM 42X72 X-RAY (DRAPES) ×4 IMPLANT
DRAPE HALF SHEET 40X57 (DRAPES) IMPLANT
DRAPE LAPAROTOMY 100X72X124 (DRAPES) ×2 IMPLANT
DRAPE SHEET LG 3/4 BI-LAMINATE (DRAPES) ×2 IMPLANT
DRSG OPSITE POSTOP 4X6 (GAUZE/BANDAGES/DRESSINGS) ×2 IMPLANT
DURAPREP 26ML APPLICATOR (WOUND CARE) ×2 IMPLANT
DURASEAL APPLICATOR TIP (TIP) IMPLANT
DURASEAL SPINE SEALANT 3ML (MISCELLANEOUS) IMPLANT
ELECT BLADE 4.0 EZ CLEAN MEGAD (MISCELLANEOUS)
ELECT REM PT RETURN 9FT ADLT (ELECTROSURGICAL) ×2
ELECTRODE BLDE 4.0 EZ CLN MEGD (MISCELLANEOUS) IMPLANT
ELECTRODE REM PT RTRN 9FT ADLT (ELECTROSURGICAL) ×1 IMPLANT
GAUZE 4X4 16PLY ~~LOC~~+RFID DBL (SPONGE) ×2 IMPLANT
GAUZE SPONGE 4X4 12PLY STRL (GAUZE/BANDAGES/DRESSINGS) IMPLANT
GLOVE SURG LTX SZ8.5 (GLOVE) ×4 IMPLANT
GLOVE SURG UNDER POLY LF SZ8.5 (GLOVE) ×4 IMPLANT
GOWN STRL REUS W/ TWL LRG LVL3 (GOWN DISPOSABLE) ×2 IMPLANT
GOWN STRL REUS W/ TWL XL LVL3 (GOWN DISPOSABLE) ×1 IMPLANT
GOWN STRL REUS W/TWL 2XL LVL3 (GOWN DISPOSABLE) ×4 IMPLANT
GOWN STRL REUS W/TWL LRG LVL3 (GOWN DISPOSABLE) ×4
GOWN STRL REUS W/TWL XL LVL3 (GOWN DISPOSABLE) ×2
GRAFT BONE PROTEIOS SM 1CC (Orthopedic Implant) ×2 IMPLANT
GUIDEWIRE NITINOL BEVEL TIP (WIRE) ×12 IMPLANT
HEMOSTAT POWDER KIT SURGIFOAM (HEMOSTASIS) ×2 IMPLANT
KIT BASIN OR (CUSTOM PROCEDURE TRAY) ×2 IMPLANT
KIT SPINE MAZOR X ROBO DISP (MISCELLANEOUS) ×2 IMPLANT
KIT TURNOVER KIT B (KITS) ×2 IMPLANT
MILL MEDIUM DISP (BLADE) ×2 IMPLANT
NEEDLE HYPO 22GX1.5 SAFETY (NEEDLE) ×2 IMPLANT
NEEDLE SPNL 18GX3.5 QUINCKE PK (NEEDLE) IMPLANT
NS IRRIG 1000ML POUR BTL (IV SOLUTION) ×2 IMPLANT
PACK LAMINECTOMY NEURO (CUSTOM PROCEDURE TRAY) ×2 IMPLANT
PAD ARMBOARD 7.5X6 YLW CONV (MISCELLANEOUS) ×6 IMPLANT
PATTIES SURGICAL .5 X1 (DISPOSABLE) ×2 IMPLANT
PIN HEAD 2.5X60MM (PIN) IMPLANT
PLASMABLADE 3.0S (MISCELLANEOUS) ×2
ROD RELINE LOROTIC TI 5.5X55MM (Rod) ×4 IMPLANT
SCREW LOCK RELINE 5.5 TULIP (Screw) ×12 IMPLANT
SCREW RELINE MAS POLY 4.5X30MM (Screw) ×4 IMPLANT
SCREW RELINE MAS POLY 4.5X35 (Screw) ×8 IMPLANT
SCREW SCHANZ SA 4.0MM (MISCELLANEOUS) IMPLANT
SPONGE SURGIFOAM ABS GEL 100 (HEMOSTASIS) ×2 IMPLANT
SPONGE T-LAP 4X18 ~~LOC~~+RFID (SPONGE) ×4 IMPLANT
SUT PROLENE 6 0 BV (SUTURE) IMPLANT
SUT VIC AB 1 CT1 18XBRD ANBCTR (SUTURE) ×1 IMPLANT
SUT VIC AB 1 CT1 8-18 (SUTURE) ×2
SUT VIC AB 2-0 CP2 18 (SUTURE) ×2 IMPLANT
SUT VIC AB 3-0 SH 8-18 (SUTURE) ×2 IMPLANT
SUT VIC AB 4-0 RB1 18 (SUTURE) ×2 IMPLANT
SYR 3ML LL SCALE MARK (SYRINGE) ×8 IMPLANT
SYR 5ML LL (SYRINGE) IMPLANT
TOWEL GREEN STERILE (TOWEL DISPOSABLE) ×2 IMPLANT
TOWEL GREEN STERILE FF (TOWEL DISPOSABLE) ×2 IMPLANT
TRAY FOLEY MTR SLVR 16FR STAT (SET/KITS/TRAYS/PACK) ×2 IMPLANT
TUBE MAZOR SA REDUCTION (TUBING) ×2 IMPLANT
WATER STERILE IRR 1000ML POUR (IV SOLUTION) ×2 IMPLANT

## 2021-04-16 NOTE — Op Note (Signed)
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 Postoperative diagnosis: Same Procedure: Thoracic laminectomy T1 T3 with decompression of the spinal canal T1-T2-T3.  Posterolateral arthrodesis with local autograft and Proteus T1-T3 pedicle screw fixation T1-T2-T3 with robotic placement of pedicle screws. Surgeon: Barnett Abu First Assistant: Coletta Memos MD Anesthesia: General endotracheal Indications: Jamie Walters is a 60 year old individual who has had significant progressive weakness and clumsiness in her lower extremities.  This been going on for about 5 months.  An MRI of the cervical spine demonstrates that the patient has significant spondylitic stenosis at C4-5 and C5-6 but at T1-2 and T2-3 she has a degenerative spondylolisthesis with cord signal change in the thoracic spinal cord.  She has been advised regarding the need for surgical decompression of these areas and is now taken to the operating room for such.  Procedure: Patient was brought to the operating room supine on the stretcher.  After the smooth induction of general endotracheal anesthesia and placement of a Foley catheter and monitoring lines she was placed in the 3 pin headrest and carefully turned to the prone position on the [place] table.  The neck was secured neutrally and radiographs were taken to confirm good positioning in AP and lateral projections.  Then the back of the neck was shaved prepped with alcohol DuraPrep and draped in a sterile fashion.  The robotic arm was also draped and prepped sterilely.  The surgery was started by making a linear incision in the posterior aspect of the neck at the cervical thoracic junction inferiorly and the dissection was carried down to the thoracodorsal fascia which was opened on either side of midline to expose ultimately the spinous processes and laminar arches of T1 and T2 and T3-T4 was also exposed superiorly.  A clamp to  allow attachment of the robotic arm to the spine was then placed on the spinous process of T4 and the robotic arm was secured to the clamp.  Registration radiographs were then obtained in the AP and oblique positions and the positions were then verified.  A surgical plan for placement of the screws was verified on the robot and the plan was then executed with placement of K wires into the pedicles of T1-T2 and T3.  Ultimately 4.5 x 30 mm screws were placed in T1 4.5 x 35 mm screws were placed in T2 4.5 x 35 mm screws were placed and T3.  With this the robotic arm was disassembled and removed and then the T2 screws were removed.  Laminectomy was then undertaken removing the inferior margin lamina of T1 and undercutting it substantially removing the lamina of T2 completely and undercutting the lamina of T3.  At the T1-T2 interval on the left side there was noted to be a substantial overgrowth of thickened Gromis interspinous ligament this was indenting and compressing the spinal canal on that left side this was densely attached to the dura and was carefully dissected away in the end the good decompression of the T1-T2 interval was obtained was also noted on inspecting radiographs of the pedicle screw placement at the right pedicle screw of T1 was placed on the low end pedicle this was then repositioned by direct drilling into the pedicle more superiorly.  The T2 screws were noted to be placed somewhat medially and replacing them allow them to be placed into a more neutral region of T2 vertebrae.  The facet joints at T1-2 and T2-3 were then decorticated and the autograft that was  harvested from the laminectomy of T2 and T1 was ground into a fine paced and mixed with a small aliquot of Proteus.  This was then packed into the lateral gutters of the interfacet spaces at T1-2 and T2-3.  55 mm precontoured rods were then used in a neutral construct between T1 and T3.  These were secured and torqued into position final  radiographs were obtained in AP and lateral projection.  Hemostasis was carefully checked in the epidural space and when verified the retractors were removed and the thoracodorsal fascia was closed with #1 Vicryl in interrupted fashion 2-0 Vicryl was used subcutaneously 3-0 Vicryl subcuticularly and final closure of 4-0 subcuticular Vicryl was performed Dermabond was placed on the skin and blood loss for the procedure was estimated at 150 cc.  Patient was returned to the recovery room after having their head removed from the 3 pin headrest.

## 2021-04-16 NOTE — H&P (Signed)
Chief Complaint: progressive weakness  History of Present Illness: Jamie Walters is an 60 y.o. female. HPI: Jamie Walters is a 60 year old right-handed female who tells me that she has had back problems for about 8 years time.  Since about April of this year she has noted some increasing clumsiness and a number of falls that have occurred.  She is used to walking about 7 or 8 miles a day with her work but recently she has had to cut this down to walking about 2 and she has had the use a cane because she has been having some increasing stumbling.  She notes that just recently she had stubbed her foot and broke a toe secondary to her difficulty with balance.  She has had this worked up by an Scientist, research (life sciences) through [employer] and MRIs of the cervical spine and the lumbar spine were ordered.  The lumbar MRI demonstrates that the patient has some spondylitic degenerative changes at L5-S1 but otherwise a healthy looking spine the MRI of the cervical spine performed at [facility] demonstrates some significant stenosis with cord signal change at the T1-T2 level.  The patient was advised to go to the nearest emergency room by her orthopedist and she came to [facility] this morning.  A lumbar MRI was repeated and this essentially recapitulates the findings that were noted on the report.  I was contacted regarding this process and I noted that the MRI of the cervical spine needs to be repeated as this is where the pathology is in the upper thoracic spine that has been noted.  Per the physicians is been noted that she has had good strength and no focal deficits could be noted on her exam here.  The MRI performed here demonstrates that there is a significant canal stenosis in the cervical spine at C4-5 and C5-6 in addition there is advanced spinal stenosis with a spondylolisthesis at T1-T2 and to a lesser extent at T2-T3 there is significant overgrowth of the posterior longitudinal ligament in addition to bulging of the  disc creating a significant stenosis at this level.  The patient notes that she has not had any significant pain between her shoulder blades although she does admit to occasional Lhermitte's phenomenon with some radiating electrical sensations.       Past Medical History:  Diagnosis Date   Allergy     Arthritis     Depression     Hypertension             Past Surgical History:  Procedure Laterality Date   TUBAL LIGATION               Family History  Problem Relation Age of Onset   Hypertension Mother     Hyperlipidemia Mother     Stroke Mother     COPD Father     Alcohol abuse Father     Arthritis Father     Cancer Father          bone cancer   Hypertension Sister     COPD Brother     COPD Brother     Mental illness Son          PTSD; [place] war veteran      Social History:  reports that she has been smoking cigarettes. She has a 10.00 pack-year smoking history. She has never used smokeless tobacco. She reports that she does not drink alcohol and does not use drugs.   Allergies: No Known Allergies   Medications:  I have reviewed the patient's current medications. Meds are noted   Lab Results Last 48 Hours        Results for orders placed or performed during the hospital encounter of 04/08/21 (from the past 48 hour(s))  CBC with Differential     Status: None    Collection Time: 04/08/21 11:49 AM  Result Value Ref Range    WBC 9.2 4.0 - 10.5 K/uL    RBC 4.03 3.87 - 5.11 MIL/uL    Hemoglobin 12.6 12.0 - 15.0 g/dL    HCT 96.2 83.6 - 62.9 %    MCV 94.3 80.0 - 100.0 fL    MCH 31.3 26.0 - 34.0 pg    MCHC 33.2 30.0 - 36.0 g/dL    RDW 47.6 54.6 - 50.3 %    Platelets 278 150 - 400 K/uL    nRBC 0.0 0.0 - 0.2 %    Neutrophils Relative % 55 %    Neutro Abs 5.0 1.7 - 7.7 K/uL    Lymphocytes Relative 33 %    Lymphs Abs 3.0 0.7 - 4.0 K/uL    Monocytes Relative 10 %    Monocytes Absolute 1.0 0.1 - 1.0 K/uL    Eosinophils Relative 1 %    Eosinophils Absolute 0.1 0.0 -  0.5 K/uL    Basophils Relative 0 %    Basophils Absolute 0.0 0.0 - 0.1 K/uL    Immature Granulocytes 1 %    Abs Immature Granulocytes 0.05 0.00 - 0.07 K/uL      Comment: Performed at [facility], [address] [place], [place] [address]  Comprehensive metabolic panel     Status: Abnormal    Collection Time: 04/08/21 11:49 AM  Result Value Ref Range    Sodium 136 135 - 145 mmol/L    Potassium 3.5 3.5 - 5.1 mmol/L    Chloride 102 98 - 111 mmol/L    CO2 25 22 - 32 mmol/L    Glucose, Bld 129 (H) 70 - 99 mg/dL      Comment: Glucose reference range applies only to samples taken after fasting for at least 8 hours.    BUN 30 (H) 8 - 23 mg/dL    Creatinine, Ser 8.12 (H) 0.44 - 1.00 mg/dL    Calcium 9.2 8.9 - 75.1 mg/dL    Total Protein 6.7 6.5 - 8.1 g/dL    Albumin 3.7 3.5 - 5.0 g/dL    AST 26 15 - 41 U/L    ALT 25 0 - 44 U/L    Alkaline Phosphatase 77 38 - 126 U/L    Total Bilirubin 0.4 0.3 - 1.2 mg/dL    GFR, Estimated 36 (L) >60 mL/min      Comment: (NOTE) Calculated using the CKD-EPI Creatinine Equation (2021)      Anion gap 9 5 - 15      Comment: Performed at [facility], [address] [place], [place] [address]         Imaging Results (Last 48 hours)  MR Cervical Spine Wo Contrast   Result Date: 04/08/2021 CLINICAL DATA:  Spinal cord injury, follow up EXAM: MRI CERVICAL SPINE WITHOUT CONTRAST TECHNIQUE: Multiplanar, multisequence MR imaging of the cervical spine was performed. No intravenous contrast was administered. COMPARISON:  None. FINDINGS: Alignment: Grade 1 retrolisthesis at C5-6 and C6-7. Grade 1 anterolisthesis at T1-2 and T2-3. Vertebrae: T1-2 discogenic edema. No acute fracture. No discitis-osteomyelitis. Cord: Faint hyperintense T2-weighted signal in the spinal cord  at the T1-2 level Posterior Fossa, vertebral arteries, paraspinal tissues: Negative. Disc levels: C1-2: Unremarkable. C2-3: Moderate right facet hypertrophy. There is no spinal canal  stenosis. No neural foraminal stenosis. C3-4: Moderate left facet hypertrophy. There is no spinal canal stenosis. Mild left neural foraminal stenosis. C4-5: Left asymmetric disc bulge with bilateral uncovertebral hypertrophy. Moderate spinal canal stenosis. Severe right and mild left neural foraminal stenosis. C5-6: Left asymmetric large disc bulge and superimposed left subarticular disc protrusion with bilateral uncovertebral hypertrophy. Severe spinal canal stenosis. Moderate right and severe left neural foraminal stenosis. C6-7: Small disc bulge with endplate spurring and central protrusion. Mild spinal canal stenosis. Moderate bilateral neural foraminal stenosis. C7-T1: Normal disc space and facet joints. There is no spinal canal stenosis. No neural foraminal stenosis. T1-2: Grade 1 anterolisthesis with disc uncovering and endplate spurring. Severe spinal canal stenosis. Severe bilateral neural foraminal stenosis. T2-3: Grade 1 anterolisthesis with left subarticular disc protrusion indenting the ventral spinal cord. But, no spinal canal stenosis. IMPRESSION: 1. 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 2. Moderate C4-5 spinal canal stenosis with severe right and mild left neural foraminal stenosis. 3. Moderate right and severe left C5-6 neural foraminal stenosis. 4. Severe bilateral T1-2 neural foraminal stenosis. Electronically Signed   By: Deatra RobinsonKevin  Herman M.D.   On: 04/08/2021 19:33   MR Lumbar Spine W Wo Contrast   Result Date: 04/08/2021 CLINICAL DATA:  Myelopathy, acute or progressive. Paraspinal mass/tumor. Worsening chronic back pain. Urinary incontinence. EXAM: MRI LUMBAR SPINE WITHOUT AND WITH CONTRAST TECHNIQUE: Multiplanar and multiecho pulse sequences of the lumbar spine were obtained without and with intravenous contrast. CONTRAST:  6.245mL GADAVIST GADOBUTROL 1 MMOL/ML IV SOLN COMPARISON:   Lumbar spine MRI 05/05/2017. Report from a 04/06/2021 lumbar spine MRI [facility] (images not available). FINDINGS: Segmentation:  Standard. Alignment:  Normal. Vertebrae: No fracture or suspicious marrow lesion. Degenerative endplate changes at L5-S1 including low level edema and enhancement. Conus medullaris and cauda equina: Conus extends to the L1-2 level and appears normal. There is a 2 mm nodular focus of enhancement within the dorsal aspect of the spinal canal at L3 along the cauda equina nerve roots likely corresponding to the abnormality described on the recent outside MRI and suspected to have been present on the noncontrast 2018 study as well. The cauda equina nerve roots are otherwise normal in appearance. Paraspinal and other soft tissues: Subcentimeter cyst in the left kidney. Disc levels: Mild disc space narrowing at L1-2 and L2-3 and severe narrowing at L5-S1 with associated disc desiccation. T12-L1: Mild facet hypertrophy without disc herniation or stenosis. L1-2: Disc bulging, small central disc protrusion with annular fissure, and mild facet hypertrophy without stenosis, unchanged from 2018. L2-3: Progressive disc bulging and mild facet and ligamentum flavum hypertrophy result in mild left greater than right lateral recess stenosis and mild left neural foraminal stenosis without significant spinal stenosis. L3-4: Minimal disc bulging and mild facet and ligamentum flavum hypertrophy without stenosis, unchanged. L4-5: Mild disc bulging and mild-to-moderate facet and ligamentum flavum hypertrophy without stenosis, unchanged. L5-S1: Disc bulging, endplate spurring, severe disc space height loss, and mild facet hypertrophy result in mild bilateral lateral recess stenosis and moderate right and moderate to severe left neural foraminal stenosis without spinal stenosis, unchanged. IMPRESSION: 1. 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 2.  Progressive disc degeneration at L2-3 since  2018 with mild lateral recess and left neural foraminal stenosis. 3. Unchanged advanced disc degeneration at L5-S1 with moderate to severe neural foraminal stenosis. 4. No spinal stenosis. Electronically Signed   By: Sebastian Ache M.D.   On: 04/08/2021 15:20       Review of Systems Constitutional:  Positive for activity change. HENT: Negative.    Eyes: Negative.   Respiratory: Negative.    Cardiovascular: Negative.   Gastrointestinal: Negative.   Endocrine: Negative.   Genitourinary: Negative.   Musculoskeletal:  Positive for back pain and gait problem. Allergic/Immunologic: Negative.   Neurological:  Positive for weakness and numbness. Hematological: Negative.   Psychiatric/Behavioral: Negative.    Blood pressure 117/62, pulse 73, temperature 98.5 F (36.9 C), temperature source Oral, resp. rate 17, height 5\' 2"  (1.575 m), weight 65.3 kg, SpO2 100 %. Physical Exam Constitutional:      Appearance: Normal appearance. She is normal weight. HENT:    Right Ear: Tympanic membrane normal.    Left Ear: Tympanic membrane normal.    Nose: Nose normal.    Mouth/Throat:    Mouth: Mucous membranes are moist. Eyes:    Extraocular Movements: Extraocular movements intact.    Conjunctiva/sclera: Conjunctivae normal.    Pupils: Pupils are equal, round, and reactive to light. Cardiovascular:    Rate and Rhythm: Normal rate and regular rhythm. Pulmonary:    Effort: Pulmonary effort is normal.    Breath sounds: Normal breath sounds. Abdominal:    General: Abdomen is flat. Bowel sounds are normal.    Palpations: Abdomen is soft. Musculoskeletal:        General: Normal range of motion.    Cervical back: Normal range of motion and neck supple. Skin:    General: Skin is warm and dry.    Capillary Refill: Capillary refill takes less than 2 seconds. Neurological:    Mental Status: She is alert.    Comments: Patient's cranial nerve examination is normal  upper extremity strength is good in the deltoids biceps triceps grips and intrinsics rapid alternating movements are somewhat slowed and testing heel-to-shin movement in the lower extremities she has some spasticity noted.  Tone is slightly increased in the lower extremities though strength is good.  Deep tendon reflexes are 3+ in the patellae and the Achilles upgoing Babinski's are noted in both lower extremities upper extremity reflexes reveal 1+ biceps and triceps reflexes bilaterally.  Hoffmann's test is negative.      Assessment/Plan: Thoracic and cervical spondylosis with myelopathy.  Plan: Patient will need a two-stage decompression posteriorly in the thoracic spine and anteriorly in the cervical spine.  The more critical stenosis occurs in the thoracic spine and will need to be addressed through a posterior operation from C7-T2.  The anterior operation will likely need a two-level anterior decompression at C4-5 and C5-6.  A CT scan of the thoracic spine should be performed now so that we may have a CT with robotic protocol to perform the posterior decompression and stabilization.  I noted that she the patient will repeat require a period of time about 3 months to be out of work to complete both operations and recover adequately.  I answered the patient's questions and she is eager to get something definitive done as she feels that her ability to walk has been deteriorating progressively now for several months time.

## 2021-04-16 NOTE — Anesthesia Procedure Notes (Signed)
Procedure Name: Intubation Date/Time: 04/16/2021 12:05 PM Performed by: Claris Che, CRNA Pre-anesthesia Checklist: Patient identified, Emergency Drugs available, Suction available, Patient being monitored and Timeout performed Patient Re-evaluated:Patient Re-evaluated prior to induction Oxygen Delivery Method: Circle system utilized Preoxygenation: Pre-oxygenation with 100% oxygen Induction Type: IV induction Ventilation: Mask ventilation without difficulty Laryngoscope Size: Mac and 4 Grade View: Grade I Tube type: Oral Tube size: 7.5 mm Placement Confirmation: ETT inserted through vocal cords under direct vision, positive ETCO2 and breath sounds checked- equal and bilateral Secured at: 22 cm Tube secured with: Tape Dental Injury: Teeth and Oropharynx as per pre-operative assessment

## 2021-04-16 NOTE — Anesthesia Preprocedure Evaluation (Addendum)
Anesthesia Evaluation  Patient identified by MRN, date of birth, ID band Patient awake    Reviewed: Allergy & Precautions, H&P , NPO status , Patient's Chart, lab work & pertinent test results, reviewed documented beta blocker date and time   Airway Mallampati: II  TM Distance: >3 FB Neck ROM: full    Dental no notable dental hx. (+) Teeth Intact, Missing,    Pulmonary neg pulmonary ROS, Current Smoker and Patient abstained from smoking.,    Pulmonary exam normal breath sounds clear to auscultation       Cardiovascular Exercise Tolerance: Good hypertension, Pt. on medications  Rhythm:regular Rate:Normal     Neuro/Psych  Headaches, PSYCHIATRIC DISORDERS Depression  Neuromuscular disease    GI/Hepatic negative GI ROS, Neg liver ROS,   Endo/Other  negative endocrine ROS  Renal/GU negative Renal ROS  negative genitourinary   Musculoskeletal  (+) Arthritis , Osteoarthritis,    Abdominal   Peds  Hematology negative hematology ROS (+)   Anesthesia Other Findings   Reproductive/Obstetrics negative OB ROS                            Anesthesia Physical Anesthesia Plan  ASA: 3  Anesthesia Plan: General   Post-op Pain Management:    Induction: Intravenous  PONV Risk Score and Plan: 3 and Ondansetron and Dexamethasone  Airway Management Planned: Oral ETT  Additional Equipment: None  Intra-op Plan:   Post-operative Plan: Extubation in OR  Informed Consent: I have reviewed the patients History and Physical, chart, labs and discussed the procedure including the risks, benefits and alternatives for the proposed anesthesia with the patient or authorized representative who has indicated his/her understanding and acceptance.     Dental Advisory Given  Plan Discussed with: CRNA and Anesthesiologist  Anesthesia Plan Comments: (  )        Anesthesia Quick Evaluation

## 2021-04-16 NOTE — Transfer of Care (Signed)
Immediate Anesthesia Transfer of Care Note  Patient: BRITTANEY BEAULIEU  Procedure(s) Performed: Thoracic one to thoracic three Posterior decompression and fusion (Back) APPLICATION OF ROBOTIC ASSISTANCE FOR SPINAL PROCEDURE (Back)  Patient Location: PACU  Anesthesia Type:General  Level of Consciousness: awake, alert  and oriented  Airway & Oxygen Therapy: Patient Spontanous Breathing  Post-op Assessment: Report given to RN and Post -op Vital signs reviewed and stable  Post vital signs: Reviewed and stable  Last Vitals:  Vitals Value Taken Time  BP 144/83 04/16/21 1606  Temp 37.1 C 04/16/21 1605  Pulse 76 04/16/21 1610  Resp 14 04/16/21 1610  SpO2 94 % 04/16/21 1610  Vitals shown include unvalidated device data.  Last Pain:  Vitals:   04/16/21 1037  TempSrc:   PainSc: 7          Complications: No notable events documented.

## 2021-04-16 NOTE — Anesthesia Postprocedure Evaluation (Signed)
Anesthesia Post Note  Patient: Jamie Walters  Procedure(s) Performed: Thoracic one to thoracic three Posterior decompression and fusion (Back) APPLICATION OF ROBOTIC ASSISTANCE FOR SPINAL PROCEDURE (Back)     Patient location during evaluation: PACU Anesthesia Type: General Level of consciousness: awake and alert Pain management: pain level controlled Vital Signs Assessment: post-procedure vital signs reviewed and stable Respiratory status: spontaneous breathing, nonlabored ventilation, respiratory function stable and patient connected to nasal cannula oxygen Cardiovascular status: blood pressure returned to baseline and stable Postop Assessment: no apparent nausea or vomiting Anesthetic complications: no   No notable events documented.  Last Vitals:  Vitals:   04/16/21 1008 04/16/21 1605  BP: (!) 175/75   Pulse: 85   Resp: 17   Temp: 36.9 C 37.1 C  SpO2: 95%     Last Pain:  Vitals:   04/16/21 1037  TempSrc:   PainSc: 7                  Ahava Kissoon

## 2021-04-17 ENCOUNTER — Encounter (HOSPITAL_COMMUNITY): Payer: Self-pay | Admitting: Neurological Surgery

## 2021-04-17 MED ORDER — OXYCODONE-ACETAMINOPHEN 5-325 MG PO TABS: 1.0000 | ORAL_TABLET | ORAL | 0 refills | Status: AC | PRN

## 2021-04-17 MED ORDER — CYCLOBENZAPRINE HCL 10 MG PO TABS
10.0000 mg | ORAL_TABLET | Freq: Three times a day (TID) | ORAL | 3 refills | Status: AC | PRN
Start: 2021-04-17 — End: ?

## 2021-04-17 MED ORDER — DEXAMETHASONE 1 MG PO TABS: ORAL_TABLET | ORAL | 0 refills | Status: AC

## 2021-04-17 MED FILL — Heparin Sodium (Porcine) Inj 1000 Unit/ML: INTRAMUSCULAR | Qty: 30 | Status: AC

## 2021-04-17 MED FILL — Sodium Chloride IV Soln 0.9%: INTRAVENOUS | Qty: 1000 | Status: AC

## 2021-04-17 NOTE — Discharge Summary (Signed)
Physician Discharge Summary  Patient ID: Jamie Walters MRN: [id number] DOB/AGE: 10/31/1960 60 y.o.  Admit date: 04/16/2021 Discharge date: 04/17/2021  Admission Diagnoses: Thoracic myelopathy secondary to spondylosis and spondylolisthesis T1-T2 and T2-T3  Discharge Diagnoses: Thoracic myelopathy secondary to spondylosis.  Spondylolisthesis T1-T2 and T2-3 Active Problems:   Thoracic spondylosis with myelopathy   Discharged Condition: good  Hospital Course: Patient was admitted to undergo surgical decompression which she tolerated well.  Her incision is clean and dry motor function is intact.  Consults: None  Significant Diagnostic Studies: None  Treatments: surgery: See op note  Discharge Exam: Blood pressure 126/67, pulse 79, temperature 97.6 F (36.4 C), temperature source Oral, resp. rate 17, height 5\' 1"  (1.549 m), weight 63.5 kg, SpO2 99 %. Incision is clean and dry motor function is intact  Disposition: Discharge disposition: 01-Home or Self Care       Discharge Instructions     Call MD for:  redness, tenderness, or signs of infection (pain, swelling, redness, odor or green/yellow discharge around incision site)   Complete by: As directed    Call MD for:  severe uncontrolled pain   Complete by: As directed    Call MD for:  temperature >100.4   Complete by: As directed    Diet - low sodium heart healthy   Complete by: As directed    Discharge wound care:   Complete by: As directed    Okay to shower. Do not apply salves or appointments to incision. No heavy lifting with the upper extremities greater than 10 pounds. May resume driving when not requiring pain medication and patient feels comfortable with doing so.   Incentive spirometry RT   Complete by: As directed    Increase activity slowly   Complete by: As directed       Allergies as of 04/17/2021   No Known Allergies      Medication List     TAKE these medications    aspirin 81 MG tablet Take 81  mg by mouth daily.   atorvastatin 40 MG tablet Commonly known as: LIPITOR Take 40 mg by mouth every evening.   buPROPion 150 MG 24 hr tablet Commonly known as: WELLBUTRIN XL Take 3 tablets (450 mg total) by mouth daily.   cyclobenzaprine 10 MG tablet Commonly known as: FLEXERIL Take 1 tablet (10 mg total) by mouth 3 (three) times daily as needed for muscle spasms. What changed: when to take this   dexamethasone 1 MG tablet Commonly known as: DECADRON 2 tablets twice daily for 2 days, one tablet twice daily for 2 days, one tablet daily for 2 days.   diclofenac 50 MG EC tablet Commonly known as: VOLTAREN Take 50 mg by mouth daily as needed for mild pain.   hydrochlorothiazide 25 MG tablet Commonly known as: HYDRODIURIL Take 25 mg by mouth daily.   HYDROcodone-acetaminophen 10-325 MG tablet Commonly known as: NORCO Take 1 tablet by mouth every 6 (six) hours as needed for moderate pain.   hydrOXYzine 25 MG tablet Commonly known as: ATARAX/VISTARIL Take 0.5-2 tablets (12.5-50 mg total) by mouth at bedtime as needed for anxiety or itching (insomnia). What changed:  how much to take when to take this   ibuprofen 800 MG tablet Commonly known as: ADVIL Take 800 mg by mouth every 8 (eight) hours as needed for moderate pain.   lisinopril 40 MG tablet Commonly known as: ZESTRIL Take 40 mg by mouth daily.   methylPREDNISolone 4 MG Tbpk tablet Commonly known  as: MEDROL DOSEPAK Take 4-24 mg by mouth See admin instructions. Take 24mg  on day 1 and decrease by 4mg  each day until gone. - pt has completed course as of 7.30.22.   metoprolol succinate 100 MG 24 hr tablet Commonly known as: TOPROL-XL Take 100 mg by mouth daily.   ondansetron 4 MG tablet Commonly known as: ZOFRAN Take 4 mg by mouth every 8 (eight) hours as needed for nausea or vomiting.   oxyCODONE-acetaminophen 5-325 MG tablet Commonly known as: PERCOCET/ROXICET Take 1-2 tablets by mouth every 4 (four) hours as  needed for severe pain.   pramipexole 0.125 MG tablet Commonly known as: MIRAPEX TAKE 1 TABLET AT BEDTIME FOR 7 DAYS,2 TABLETS EVERY EVENING FOR 7 DAYS,3 TABLETS FOR 7 DAYS,4 TABLETS FOR 7 DAYS What changed: See the new instructions.               Discharge Care Instructions  (From admission, onward)           Start     Ordered   04/17/21 0000  Discharge wound care:       Comments: Okay to shower. Do not apply salves or appointments to incision. No heavy lifting with the upper extremities greater than 10 pounds. May resume driving when not requiring pain medication and patient feels comfortable with doing so.   04/17/21 0954             Signed: 06/17/21 Lorenda Grecco 04/17/2021, 9:54 AM

## 2021-04-17 NOTE — Discharge Instructions (Addendum)
Wound Care Remove outer dressing in 2-3 days Leave incision open to air. You may shower. Do not scrub directly on incision.  Do not put any creams, lotions, or ointments on incision. Activity Walk each and every day, increasing distance each day. No lifting greater than 10 lbs.  Avoid bending, and Lifting No driving for 2 weeks; may ride as a passenger locally.  Diet Resume your normal diet.   Return to Work Will be discussed at you follow up appointment. \ Call Your Doctor If Any of These Occur Redness, drainage, or swelling at the wound.  Temperature greater than 101 degrees. Severe pain not relieved by pain medication. Incision starts to come apart.  Follow Up Appt Call [phone]) for problems.

## 2021-04-17 NOTE — Evaluation (Signed)
Occupational Therapy Evaluation Patient Details Name: Jamie Walters MRN: [id number] DOB: [birth year] Today's Date: 04/17/2021    History of Present Illness 60 y.o. female presenting with spondylolisthesis with stenosis and myelopathy T1-2 and T2-3 s/p laminectomy/decompression on 8/1. PMHx significant for depresion, recurrent falls with injuries and HTN.   Clinical Impression   PTA patient was living with her spouse and adult son in a private residence with several STE and bilateral rails and was independent with ADLs/IADLs with use of SPC in community dwellings only. Patient currently presents slightly below baseline level of function demonstrating observed ADLs with supervision to Min guard grossly with use of RW. OT provided education on spinal precautions, home set-up to maximize safety and independence with self-care tasks, and acquisition/use of AE. OT also provided education on home modifications to reduce risk of falls including removing throw rugs. Patient/family expressed verbal understanding. OT will continue to follow acutely.     Follow Up Recommendations  No OT follow up;Supervision - Intermittent (Initial supervision/assist from family with ADLs and shower transfers)    Equipment Recommendations  3 in 1 bedside commode    Recommendations for Other Services       Precautions / Restrictions Precautions Precautions: Fall;Back Precaution Booklet Issued: Yes (comment) Precaution Comments: Able to recall 3/3 back precautions at conclusion of session. Required Braces or Orthoses:  (No brace required) Restrictions Weight Bearing Restrictions: No      Mobility Bed Mobility Overal bed mobility: Modified Independent                  Transfers Overall transfer level: Needs assistance Equipment used: Rolling walker (2 wheeled) Transfers: Sit to/from Stand Sit to Stand: Supervision         General transfer comment: Supervision A from EOB positioned in lowest  setting and from standard height commode; cues for hand placement and pacing    Balance Overall balance assessment: Needs assistance;History of Falls (Reports 6 falls in last several months; several resulting in injury) Sitting-balance support: No upper extremity supported;Feet unsupported Sitting balance-Leahy Scale: Good     Standing balance support: Bilateral upper extremity supported;During functional activity Standing balance-Leahy Scale: Fair Standing balance comment: Able to maintain static standing balance with and without unilateral UE support; reliant on BUE on RW for dynamic balance.                           ADL either performed or assessed with clinical judgement   ADL Overall ADL's : Needs assistance/impaired     Grooming: Supervision/safety;Standing Grooming Details (indicate cue type and reason): 2/3 grooming tasks at sink surface.         Upper Body Dressing : Set up;Sitting   Lower Body Dressing: Min guard;Sit to/from stand Lower Body Dressing Details (indicate cue type and reason): Min guard for steadying/safety. Toilet Transfer: Firefighter Details (indicate cue type and reason): To standard height commode; cues for hand placement on grab bars. Toileting- Clothing Manipulation and Hygiene: Supervision/safety;Sit to/from stand Toileting - Clothing Manipulation Details (indicate cue type and reason): Supervision A for safety.     Functional mobility during ADLs: Supervision/safety;Rolling walker       Vision Baseline Vision/History: Wears glasses Wears Glasses: At all times Patient Visual Report: No change from baseline Vision Assessment?: No apparent visual deficits     Perception     Praxis      Pertinent Vitals/Pain Pain Assessment: 0-10 Pain Score: 9  Pain Location:  Neck/back (incisional) Pain Descriptors / Indicators: Aching;Sore Pain Intervention(s): Limited activity within patient's tolerance;Monitored  during session;Premedicated before session;Repositioned     Hand Dominance Right   Extremity/Trunk Assessment Upper Extremity Assessment Upper Extremity Assessment: Overall WFL for tasks assessed   Lower Extremity Assessment Lower Extremity Assessment: Defer to PT evaluation   Cervical / Trunk Assessment Cervical / Trunk Assessment: Other exceptions Cervical / Trunk Exceptions: s/p thoracic surgery   Communication Communication Communication: No difficulties   Cognition Arousal/Alertness: Awake/alert Behavior During Therapy: WFL for tasks assessed/performed Overall Cognitive Status: Within Functional Limits for tasks assessed                                     General Comments  Daughter present at bedside.    Exercises     Shoulder Instructions      Home Living Family/patient expects to be discharged to:: Private residence Living Arrangements: Spouse/significant other;Children Available Help at Discharge: Family;Available 24 hours/day (Daughter here for 2 weeks from out of town) Type of Home: House Home Access: Stairs to enter [employer] of Steps: 5-6 Entrance Stairs-Rails: Right;Left;Can reach both Home Layout: One level     Bathroom Shower/Tub: Walk-in shower;Door   [employer]:  (comfort height)     Home Equipment: Walker - 2 wheels;Cane - single point          Prior Functioning/Environment Level of Independence: Independent with assistive device(s)        Comments: Mod I with ADLs/IADLs; use of SPC in community dwellings only; works as a Merchandiser, retail for an [employer]        OT Problem List: Impaired balance (sitting and/or standing);Pain      OT Treatment/Interventions:      OT Goals(Current goals can be found in the care plan section) Acute Rehab OT Goals Patient Stated Goal: To return to work. OT Goal Formulation: With patient Time For Goal Achievement: 05/01/21 Potential to Achieve Goals: Good ADL  Goals Additional ADL Goal #1: Patient will complete ADLs with DME and Mod I and good adherence to spinal precautions in prep for safe d/c home. Additional ADL Goal #2: Patient will recall 3 strategies to reduce risk of falls in prep for safe d/c home.  OT Frequency:     Barriers to D/C:            Co-evaluation              AM-PAC OT "6 Clicks" Daily Activity     Outcome Measure Help from another person eating meals?: None Help from another person taking care of personal grooming?: A Little Help from another person toileting, which includes using toliet, bedpan, or urinal?: A Little Help from another person bathing (including washing, rinsing, drying)?: A Little Help from another person to put on and taking off regular upper body clothing?: A Little Help from another person to put on and taking off regular lower body clothing?: A Little 6 Click Score: 19   End of Session Equipment Utilized During Treatment: Rolling walker Nurse Communication: Mobility status;Other (comment) (Response to treatment)  Activity Tolerance: Patient tolerated treatment well Patient left: in chair;with call bell/phone within reach;with family/visitor present  OT Visit Diagnosis: Unsteadiness on feet (R26.81)                Time: 7353-2992 OT Time Calculation (min): 20 min Charges:  OT General Charges $OT Visit: 1 Visit OT Evaluation $  OT Eval Low Complexity: 1 Low  Saydi Kobel H. OTR/L Supplemental OT, Department of rehab services [phone]  Laveyah Oriol R H. 04/17/2021, 8:17 AM

## 2021-04-17 NOTE — Progress Notes (Signed)
Patient alert and oriented, voiding adequately, skin clean, dry and intact without evidence of skin break down, or symptoms of complications - no redness or edema noted, only slight tenderness at site.  Patient states pain is manageable at time of discharge. Patient has an appointment with MD in 3 weeks 

## 2021-04-17 NOTE — Evaluation (Signed)
Physical Therapy Evaluation  Patient Details Name: Jamie Walters MRN: [id number] DOB: 1960-10-04 Today's Date: 04/17/2021   History of Present Illness  60 y.o. female presenting with spondylolisthesis with stenosis and myelopathy T1-2 and T2-3 s/p laminectomy/decompression on 8/1. PMHx significant for depresion, recurrent falls with injuries and HTN.   Clinical Impression  Pt admitted with above diagnosis. At the time of PT eval, pt was able to demonstrate transfers and ambulation with gross min guard assist to supervision for safety and RW for support. Pt was educated on precautions, positioning recommendations, appropriate activity progression, and car transfer. Pt currently with functional limitations due to the deficits listed below (see PT Problem List). Pt will benefit from skilled PT to increase their independence and safety with mobility to allow discharge to the venue listed below.      Follow Up Recommendations No PT follow up;Supervision for mobility/OOB    Equipment Recommendations  None recommended by PT    Recommendations for Other Services       Precautions / Restrictions Precautions Precautions: Fall;Back Precaution Booklet Issued: Yes (comment) Precaution Comments: Able to recall 3/3 back precautions at conclusion of session. Required Braces or Orthoses:  (No brace required) Restrictions Weight Bearing Restrictions: No      Mobility  Bed Mobility Overal bed mobility: Modified Independent             General bed mobility comments: Pt was received sitting up in the recliner. Verbally reviewed log roll technique.    Transfers Overall transfer level: Needs assistance Equipment used: Rolling walker (2 wheeled) Transfers: Sit to/from Stand Sit to Stand: Supervision         General transfer comment: VC's for improved posture during power up to full stand. Noted decreated eccentric motor control with stand>sit.  Ambulation/Gait Ambulation/Gait  assistance: Supervision Gait Distance (Feet): 200 Feet Assistive device: Rolling walker (2 wheeled) Gait Pattern/deviations: Step-through pattern;Decreased stride length;Trunk flexed Gait velocity: Decreased Gait velocity interpretation: <1.31 ft/sec, indicative of household ambulator General Gait Details: VC's for improved posture, increased heel strike, and closer walker proximity. Decreased control of terminal knee extension, and a couple instances where knee "snapped" back into extension during gait cycle.  Stairs Stairs: Yes Stairs assistance: Min guard Stair Management: One rail Left;Forwards;Alternating pattern;Step to pattern Number of Stairs: 5 General stair comments: Alternating feet to ascend, and step-to pattern to descend. Recommended step-to pattern throughout due to decreased general strength in hip flexors and quads and history of falls.  Wheelchair Mobility    Modified Rankin (Stroke Patients Only)       Balance Overall balance assessment: Needs assistance;History of Falls (Reports 6 falls in last several months; several resulting in injury) Sitting-balance support: No upper extremity supported;Feet unsupported Sitting balance-Leahy Scale: Good     Standing balance support: Bilateral upper extremity supported;During functional activity Standing balance-Leahy Scale: Fair Standing balance comment: Able to maintain static standing balance with and without unilateral UE support; reliant on BUE on RW for dynamic balance.                             Pertinent Vitals/Pain Pain Assessment: Faces Pain Score: 9  Faces Pain Scale: Hurts little more Pain Location: Neck/back (incisional) Pain Descriptors / Indicators: Aching;Sore Pain Intervention(s): Limited activity within patient's tolerance;Monitored during session;Repositioned    Home Living Family/patient expects to be discharged to:: Private residence Living Arrangements: Spouse/significant  other;Children Available Help at Discharge: Family;Available 24 hours/day (Daughter here for  2 weeks from out of town) Type of Home: House Home Access: Stairs to enter Entrance Stairs-Rails: Doctor, general practice of Steps: 5-6 Home Layout: One level Home Equipment: Environmental consultant - 2 wheels;Cane - single point      Prior Function Level of Independence: Independent with assistive device(s)         Comments: Mod I with ADLs/IADLs; use of SPC in community dwellings only; works as a Merchandiser, 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    Cervical / Trunk Assessment Cervical / Trunk Assessment: Other exceptions Cervical / Trunk Exceptions: s/p thoracic surgery  Communication   Communication: No difficulties  Cognition Arousal/Alertness: Awake/alert Behavior During Therapy: WFL for tasks assessed/performed Overall Cognitive Status: Within Functional Limits for tasks assessed                                        General Comments General comments (skin integrity, edema, etc.): Daughter present at bedside.    Exercises     Assessment/Plan    PT Assessment Patient needs continued PT services  PT Problem List Decreased strength;Decreased activity tolerance;Decreased balance;Decreased mobility;Decreased knowledge of use of DME;Decreased safety awareness;Decreased knowledge of precautions;Pain       PT Treatment Interventions DME instruction;Gait training;Stair training;Functional mobility training;Therapeutic activities;Therapeutic exercise;Neuromuscular re-education;Patient/family education     PT Goals (Current goals can be found in the Care Plan section)  Acute Rehab PT Goals Patient Stated Goal: To return to work eventually - second surgery planned in 6 weeks per pt report PT Goal Formulation: With patient/family Time For Goal Achievement: 04/24/21 Potential to Achieve Goals: Good    Frequency Min 5X/week   Barriers to discharge        Co-evaluation               AM-PAC PT "6 Clicks" Mobility  Outcome Measure Help needed turning from your back to your side while in a flat bed without using bedrails?: None Help needed moving from lying on your back to sitting on the side of a flat bed without using bedrails?: A Little Help needed moving to and from a bed to a chair (including a wheelchair)?: A Little Help needed standing up from a chair using your arms (e.g., wheelchair or bedside chair)?: A Little Help needed to walk in hospital room?: A Little Help needed climbing 3-5 steps with a railing? : A Little 6 Click Score: 19    End of Session Equipment Utilized During Treatment: Gait belt Activity Tolerance: Patient tolerated treatment well Patient left: in chair;with call bell/phone within reach;with family/visitor present Nurse Communication: Mobility status PT Visit Diagnosis: Unsteadiness on feet (R26.81);Pain Pain - part of body:  (back)    Time: 0812-0830 PT Time Calculation (min) (ACUTE ONLY): 18 min   Charges:   PT Evaluation $PT Eval Low Complexity: 1 Low          Conni Slipper, PT, DPT Acute Rehabilitation Services Pager: [phone] Office: [phone]   Marylynn Pearson 04/17/2021, 8:51 AM

## 2021-04-26 ENCOUNTER — Encounter (HOSPITAL_COMMUNITY): Payer: Self-pay | Admitting: Neurological Surgery

## 2021-06-28 ENCOUNTER — Other Ambulatory Visit (HOSPITAL_COMMUNITY): Payer: Self-pay | Admitting: Neurological Surgery

## 2021-06-28 ENCOUNTER — Other Ambulatory Visit: Payer: Self-pay | Admitting: Neurological Surgery

## 2021-06-28 DIAGNOSIS — M4714 Other spondylosis with myelopathy, thoracic region: Secondary | ICD-10-CM

## 2021-07-09 ENCOUNTER — Other Ambulatory Visit: Payer: Self-pay

## 2021-07-09 ENCOUNTER — Ambulatory Visit (HOSPITAL_COMMUNITY)
Admission: RE | Admit: 2021-07-09 | Discharge: 2021-07-09 | Disposition: A | Discharge status: Home / Self Care | Payer: 59 | Source: Ambulatory Visit | Attending: Neurological Surgery | Admitting: Neurological Surgery

## 2021-07-09 DIAGNOSIS — M4714 Other spondylosis with myelopathy, thoracic region: Secondary | ICD-10-CM | POA: Insufficient documentation

## 2023-09-15 ENCOUNTER — Telehealth: Payer: Self-pay | Admitting: Cardiology

## 2023-09-15 NOTE — Telephone Encounter (Signed)
Per scheduling team ..I don't have anything open for tomorrow unfortunately. I've called all of our new patients in [place] to confirm their appts for tomorrow, some did not answer but those that did answer did confirm.

## 2023-09-15 NOTE — Telephone Encounter (Signed)
   Name: Jamie Walters  DOB: 23-Apr-1961  MRN: [id number]  Primary Cardiologist: None  Chart reviewed as part of pre-operative protocol coverage. Because of Jamie Walters past medical history and time since last visit, she will require a follow-up in-office visit in order to better assess preoperative cardiovascular risk.  Pre-op covering staff: - Please schedule appointment and call patient to inform them. If patient already had an upcoming appointment within acceptable timeframe, please add "pre-op clearance" to the appointment notes so provider is aware. - Please contact requesting surgeon's office via preferred method (i.e, phone, fax) to inform them of need for appointment prior to surgery.  She has not been seen since 2019, therefore med list might not be correct.  She will need to be seen by a physician to reestablish care and medications can be checked and holding parameters if applicable can be recommended at that time.  Sharlene Dory, [name]  09/15/2023, 2:16 PM

## 2023-09-15 NOTE — Telephone Encounter (Signed)
I left a message for surgery scheduler Angie that the pt is going to need a new pt appt as pt last seen 5 yrs ago. Will need a new pt referral as well sent to our office. I will ask our scheduling team to see if they can help schedule NEW PT APPT PREOP CLEARANCE.

## 2023-09-15 NOTE — Telephone Encounter (Signed)
   Pre-operative Risk Assessment    Patient Name: Jamie Walters  DOB: 1961-02-12 MRN: [id number]      Request for Surgical Clearance    Procedure:   XLIF L2-L3 WITH LATERAL PATEFIXATION INTERBODY FUSION General Anesthesia  Date of Surgery:  Clearance 09/18/23                                 Surgeon:  Barnett Abu, MD Surgeon's Group or Practice Name:  [facility] Phone number:  [phone] Fax number:  [phone]   Type of Clearance Requested:   - Medical  - Pharmacy:  Hold if applicable      Type of Anesthesia:  General    Additional requests/questions:    SignedSeymour Bars   09/15/2023, 1:35 PM

## 2023-09-16 NOTE — Telephone Encounter (Signed)
LVM with patient to call our office to schedule NP appt for preop clearance.

## 2023-09-18 NOTE — Telephone Encounter (Signed)
 LVM to schedule NP appt for surgical clearance

## 2023-09-18 NOTE — Telephone Encounter (Signed)
 I will update the surgeon office that we have been trying to reach the pt to schedule a NEW PT APPT FOR PREOP CLEARANCE. Pt has not returned our calls.   Pt needs to call [phone] and schedule new pt appt for preop clearance.

## 2023-09-22 NOTE — Telephone Encounter (Signed)
 Spoke with patient. Patient had not returned our calls because she has already had the procedure done.

## 2024-07-28 ENCOUNTER — Ambulatory Visit: Admitting: Cardiology

## 2024-08-23 ENCOUNTER — Ambulatory Visit: Payer: Self-pay | Admitting: Internal Medicine

## 2024-08-23 ENCOUNTER — Ambulatory Visit: Admitting: Internal Medicine

## 2024-10-22 ENCOUNTER — Other Ambulatory Visit: Payer: Self-pay | Admitting: Neurological Surgery

## 2024-10-22 ENCOUNTER — Other Ambulatory Visit (HOSPITAL_COMMUNITY): Payer: Self-pay | Admitting: Neurological Surgery

## 2024-10-22 DIAGNOSIS — M4714 Other spondylosis with myelopathy, thoracic region: Secondary | ICD-10-CM

## 2024-10-27 ENCOUNTER — Ambulatory Visit: Admitting: Internal Medicine

## 2024-12-01 ENCOUNTER — Other Ambulatory Visit (HOSPITAL_COMMUNITY)
